# Patient Record
Sex: Female | Born: 1995 | Race: Black or African American | Hispanic: No | Marital: Single | Smoking: Former smoker
Health system: Southern US, Community
[De-identification: ages and names within clinical notes are randomized; demographics above are authoritative.]

## PROBLEM LIST (undated history)

## (undated) DIAGNOSIS — D649 Anemia, unspecified: Secondary | ICD-10-CM

## (undated) DIAGNOSIS — J45909 Unspecified asthma, uncomplicated: Secondary | ICD-10-CM

## (undated) HISTORY — PX: NO PAST SURGERIES: SHX2092

---
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
## 2017-01-17 ENCOUNTER — Ambulatory Visit: Payer: Self-pay | Admitting: Internal Medicine

## 2017-02-22 ENCOUNTER — Ambulatory Visit: Payer: Medicaid Other | Admitting: Family Medicine

## 2017-02-23 ENCOUNTER — Ambulatory Visit (INDEPENDENT_AMBULATORY_CARE_PROVIDER_SITE_OTHER): Payer: Medicaid Other | Admitting: Family Medicine

## 2017-02-23 ENCOUNTER — Encounter: Payer: Self-pay | Admitting: Family Medicine

## 2017-02-23 VITALS — BP 96/56 | HR 67 | Temp 98.5°F | Ht 60.0 in | Wt 103.2 lb

## 2017-02-23 DIAGNOSIS — Z3490 Encounter for supervision of normal pregnancy, unspecified, unspecified trimester: Secondary | ICD-10-CM

## 2017-02-23 DIAGNOSIS — Z641 Problems related to multiparity: Secondary | ICD-10-CM | POA: Insufficient documentation

## 2017-02-23 DIAGNOSIS — Z3401 Encounter for supervision of normal first pregnancy, first trimester: Secondary | ICD-10-CM

## 2017-02-23 DIAGNOSIS — O219 Vomiting of pregnancy, unspecified: Secondary | ICD-10-CM | POA: Diagnosis not present

## 2017-02-23 DIAGNOSIS — Z331 Pregnant state, incidental: Secondary | ICD-10-CM | POA: Insufficient documentation

## 2017-02-23 DIAGNOSIS — N912 Amenorrhea, unspecified: Secondary | ICD-10-CM | POA: Diagnosis not present

## 2017-02-23 HISTORY — DX: Encounter for supervision of normal pregnancy, unspecified, unspecified trimester: Z34.90

## 2017-02-23 LAB — POCT URINE PREGNANCY: Preg Test, Ur: POSITIVE — AB

## 2017-02-23 MED ORDER — PRENATAL 19 29-1 MG PO CHEW: 1.0000 | CHEWABLE_TABLET | Freq: Every day | ORAL | 3 refills | Status: DC

## 2017-02-23 MED ORDER — DOXYLAMINE-PYRIDOXINE 10-10 MG PO TBEC: 2.0000 | DELAYED_RELEASE_TABLET | Freq: Every day | ORAL | 3 refills | Status: DC

## 2017-02-23 NOTE — Assessment & Plan Note (Signed)
-   reassured by only 3 lb drop in weight since July; however, blood pressure is on the low side - prescribed Diclegis 10-10 mg 2-4 tablets daily - encouraged sips of gatorade and food as tolerated - will see patient on Monday or earlier for follow up of her nausea

## 2017-02-23 NOTE — Assessment & Plan Note (Signed)
-   will schedule dating ultrasound for the next 1-2 days - prescribed prenatal vitamins - will do OB panel in 1-2 weeks

## 2017-02-23 NOTE — Progress Notes (Signed)
   Subjective:    Kathleen Morrow - 21 y.o. female MRN [id number]  Date of birth: 11/26/1995  HPI  Kathleen Morrow is here after having two positive pregnancy tests at home about one week ago.   First pregnancy, first trimester - unplanned pregnancy, relationship with FOB is not good right now, although patient denies feeling unsafe around him - unsure if she will continue the pregnancy - reports feeling stressed due to this new pregnancy and her strained relationship with FOB - is not taking prenatal vitamins  Nausea during pregnancy - nausea is constant, with vomiting occurring throughout the day - has not been able to go to work, states showering is difficult - concerned about weight loss - has tried different juices and ginger, states that the only thing she can keep down is gatorade - this is also a source of stress for her   Health Maintenance:  Health Maintenance Due  Topic Date Due  . INFLUENZA VACCINE  12/01/2016    -  reports that she has never smoked. She has never used smokeless tobacco. - Review of Systems: Per HPI. - Past Medical History: Patient Active Problem List   Diagnosis Date Noted  . Encounter for supervision of normal first pregnancy in first trimester 02/23/2017  . Nausea and vomiting during pregnancy 02/23/2017  . 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 BEHAVIOR PROBLEM 09/25/2008  . ALLERGIC RHINITIS 09/13/2007  . ASTHMA, PERSISTENT 06/30/2006   - Medications: reviewed and updated   Objective:   Physical Exam BP (!) 96/56 (BP Location: Left Arm, Patient Position: Sitting, Cuff Size: Small)   Pulse 67   Temp 98.5 F (36.9 C) (Oral)   Ht 5' (1.524 m)   Wt 103 lb 3.2 oz (46.8 kg)   LMP 12/28/2016 (Approximate)   SpO2 99%   BMI 20.15 kg/m  Gen: NAD, alert, cooperative with exam, well-appearing HEENT: NCAT, clear conjunctiva, MMM, supple neck CV: RRR, good S1/S2, no murmur, no edema, capillary refill brisk   Resp: CTABL, no wheezes, non-labored Abd: SNTND, BS present, no guarding or organomegaly Skin: no rashes, normal turgor  Neuro: no gross deficits.  Psych: good insight, alert and oriented  Spitting frequently into a vomit bag during exam  Clinic performed pregnancy test is positive    Assessment & Plan:   Encounter for supervision of normal first pregnancy in first trimester - will schedule dating ultrasound for the next 1-2 days - prescribed prenatal vitamins - will do OB panel in 1-2 weeks  Nausea and vomiting during pregnancy - reassured by only 3 lb drop in weight since July; however, blood pressure is on the low side - prescribed Diclegis 10-10 mg 2-4 tablets daily - encouraged sips of gatorade and food as tolerated - will see patient on Monday or earlier for follow up of her nausea    Lezlie OctaveAmanda Rogina Schiano, M.D. 02/23/2017, 5:30 PM PGY-1, [facility]

## 2017-02-23 NOTE — Patient Instructions (Signed)
It was nice meeting you today Kathleen Morrow!  Please start taking Diclegis for your nausea.  Please start with 2 tablets at night, then if your nausea does not improve, take one tablet in the morning and 2 tablets at night.  If there is still no improvement, take MAX 4 tablets per day.  We would like for you to come back on Monday to follow up on your nausea.  We would like to see you in two weeks for your prenatal labs.  Please start taking prenatal vitamins for your and your baby's health.  If you have any questions or concerns, please feel free to call the clinic.   Be well,  Dr. Frances FurbishWinfrey

## 2017-02-24 ENCOUNTER — Telehealth: Payer: Self-pay | Admitting: *Deleted

## 2017-02-24 NOTE — Telephone Encounter (Signed)
-----   Message from Lennox SoldersAmanda C Winfrey, MD sent at 02/23/2017  5:16 PM EDT ----- Regarding: Dating US Scheduling Andreas BlowerHey Jessica,  Would you please schedule a dating OB ultrasound for [place]?  I would like for her to get this US in the next 1-2 days if possible.  She says the best number to call her is her mother's number at [phone].  Thank you!  Marchelle FolksAmanda

## 2017-02-24 NOTE — Telephone Encounter (Signed)
lmovm for pt to return call.   Appt [facility] for 10/30 @ 2pm [facility].  Please inform when she calls back.   Of note, This is the first available.  Will call back if provider has a different plan. Fleeger, Maryjo RochesterJessica Dawn, CMA

## 2017-03-01 ENCOUNTER — Ambulatory Visit (HOSPITAL_COMMUNITY)
Admission: RE | Admit: 2017-03-01 | Discharge: 2017-03-01 | Disposition: A | Discharge status: Home / Self Care | Payer: Medicaid Other | Source: Ambulatory Visit | Attending: Family Medicine | Admitting: Family Medicine

## 2017-03-01 DIAGNOSIS — Z3A01 Less than 8 weeks gestation of pregnancy: Secondary | ICD-10-CM | POA: Insufficient documentation

## 2017-03-01 DIAGNOSIS — Z3401 Encounter for supervision of normal first pregnancy, first trimester: Secondary | ICD-10-CM | POA: Diagnosis not present

## 2017-03-18 DIAGNOSIS — Z7253 High risk bisexual behavior: Secondary | ICD-10-CM | POA: Diagnosis not present

## 2017-03-18 DIAGNOSIS — O039 Complete or unspecified spontaneous abortion without complication: Secondary | ICD-10-CM | POA: Diagnosis not present

## 2017-05-20 ENCOUNTER — Ambulatory Visit: Payer: Medicaid Other | Admitting: Family Medicine

## 2017-06-14 ENCOUNTER — Ambulatory Visit: Payer: Medicaid Other | Admitting: Family Medicine

## 2017-08-03 ENCOUNTER — Ambulatory Visit: Payer: Medicaid Other | Admitting: Family Medicine

## 2017-08-24 ENCOUNTER — Other Ambulatory Visit (HOSPITAL_COMMUNITY)
Admission: RE | Admit: 2017-08-24 | Discharge: 2017-08-24 | Disposition: A | Discharge status: Home / Self Care | Payer: Medicaid Other | Source: Ambulatory Visit | Attending: Family Medicine | Admitting: Family Medicine

## 2017-08-24 ENCOUNTER — Telehealth: Payer: Self-pay

## 2017-08-24 ENCOUNTER — Encounter: Payer: Self-pay | Admitting: Family Medicine

## 2017-08-24 ENCOUNTER — Ambulatory Visit (INDEPENDENT_AMBULATORY_CARE_PROVIDER_SITE_OTHER): Payer: Medicaid Other | Admitting: Family Medicine

## 2017-08-24 VITALS — BP 100/60 | HR 72 | Temp 97.8°F | Ht 60.0 in | Wt 112.0 lb

## 2017-08-24 DIAGNOSIS — Z113 Encounter for screening for infections with a predominantly sexual mode of transmission: Secondary | ICD-10-CM | POA: Insufficient documentation

## 2017-08-24 DIAGNOSIS — Z3490 Encounter for supervision of normal pregnancy, unspecified, unspecified trimester: Secondary | ICD-10-CM | POA: Diagnosis not present

## 2017-08-24 DIAGNOSIS — Z3401 Encounter for supervision of normal first pregnancy, first trimester: Secondary | ICD-10-CM | POA: Diagnosis not present

## 2017-08-24 DIAGNOSIS — N926 Irregular menstruation, unspecified: Secondary | ICD-10-CM

## 2017-08-24 DIAGNOSIS — N898 Other specified noninflammatory disorders of vagina: Secondary | ICD-10-CM | POA: Diagnosis not present

## 2017-08-24 LAB — POCT WET PREP (WET MOUNT)
Clue Cells Wet Prep Whiff POC: NEGATIVE
TRICHOMONAS WET PREP HPF POC: ABSENT

## 2017-08-24 LAB — POCT URINE PREGNANCY: PREG TEST UR: POSITIVE — AB

## 2017-08-24 MED ORDER — PRENATAL 19 29-1 MG PO CHEW: 1.0000 | CHEWABLE_TABLET | Freq: Every day | ORAL | 3 refills | Status: DC

## 2017-08-24 NOTE — Telephone Encounter (Signed)
Called and spoke to patient informing her of her appointment of 08/26/2017 at [facility]. Arrival time is [phone] with a full bladder.  Patient was happy with appointment and time. Glennie Hawk.Simpson, Michelle R, CMA

## 2017-08-24 NOTE — Patient Instructions (Addendum)
Good to see you today!  Thanks for coming in.  Do not take any medications other than prenatal vitamins  We will order an US so you can know your dates,  We will call you to schedule it.  If you need any help making a decision please contact us  You do not have an infection that we can see now.  We will wait for cultures.  I think most of your discharge is due to RingoNair.  I would not use any other vaginal products.  It should improve in a few days

## 2017-08-24 NOTE — Assessment & Plan Note (Signed)
Likely due to Darene Lamerair that irritated mucosa.  No signs of infection.  Should resolve on own

## 2017-08-24 NOTE — Assessment & Plan Note (Signed)
Unsure of date.  Will get US.

## 2017-08-24 NOTE — Progress Notes (Signed)
Subjective  Kathleen Morrow is a 22 y.o. female is presenting with the following  PREGNANCY LMP thinks sometimes in early Feb?   Not sure whether she wants to keep this pregnancy.  Still weighing her options.  Wants to know her date.  No nausea and vomiting  VAGINAL IRRITATION Has whitsh vaginal discharge after she used Nair hair removal in her pubic area.  Feels irritated.  Did not have a discharge prior to this.  Unsure if her female partner has any std symptoms.  No pain or lesions or fever   Chief Complaint noted Review of Symptoms - see HPI PMH - Smoking status noted.    Objective Vital Signs reviewed BP 100/60 (BP Location: Left Arm, Patient Position: Sitting, Cuff Size: Normal)   Pulse 72   Temp 97.8 F (36.6 C) (Oral)   Ht 5' (1.524 m)   Wt 112 lb (50.8 kg)   LMP 06/08/2017   SpO2 100%   BMI 21.87 kg/m   Vaginal exam - whitish discoloration of mucosa just inside vagina.  No discharge from os or in vault Wet prep and cultures done  Assessments/Plans  See after visit summary for details of patient instuctions  Pregnancy Unsure of date.  Will get US.    Vaginal irritation Likely due to Darene Lamerair that irritated mucosa.  No signs of infection.  Should resolve on own

## 2017-08-25 ENCOUNTER — Telehealth: Payer: Self-pay | Admitting: Family Medicine

## 2017-08-25 ENCOUNTER — Telehealth: Payer: Self-pay

## 2017-08-25 LAB — CERVICOVAGINAL ANCILLARY ONLY
Chlamydia: POSITIVE — AB
NEISSERIA GONORRHEA: NEGATIVE

## 2017-08-25 NOTE — Telephone Encounter (Signed)
Pt was returning the call concerning her lab results.

## 2017-08-25 NOTE — Telephone Encounter (Signed)
[facility] of Prenatal 19. Pharmacist states PrePlus is preferred with Medicaid. Changed to PrePlus. Ples SpecterAlisa Romulo Okray, RN [facility] RN)

## 2017-08-25 NOTE — Telephone Encounter (Addendum)
Positive chlamydia  Left VM to call us  Need Azithromycin 1 gm by mouth and Rocephin 125 mg IM  once observed therapy.  Please have her come in for a nurse visit

## 2017-08-25 NOTE — Telephone Encounter (Signed)
Pt informed and scheduled her for a nurse visit tomorrow. Purvis Sidle Bruna PotterBlount, CMA

## 2017-08-26 ENCOUNTER — Ambulatory Visit (HOSPITAL_COMMUNITY)
Admission: RE | Admit: 2017-08-26 | Discharge: 2017-08-26 | Disposition: A | Discharge status: Home / Self Care | Payer: Medicaid Other | Source: Ambulatory Visit | Attending: Family Medicine | Admitting: Family Medicine

## 2017-08-26 ENCOUNTER — Ambulatory Visit (INDEPENDENT_AMBULATORY_CARE_PROVIDER_SITE_OTHER): Payer: Medicaid Other

## 2017-08-26 DIAGNOSIS — Z3A11 11 weeks gestation of pregnancy: Secondary | ICD-10-CM | POA: Insufficient documentation

## 2017-08-26 DIAGNOSIS — Z202 Contact with and (suspected) exposure to infections with a predominantly sexual mode of transmission: Secondary | ICD-10-CM | POA: Diagnosis not present

## 2017-08-26 DIAGNOSIS — Z3687 Encounter for antenatal screening for uncertain dates: Secondary | ICD-10-CM | POA: Insufficient documentation

## 2017-08-26 DIAGNOSIS — A749 Chlamydial infection, unspecified: Secondary | ICD-10-CM | POA: Diagnosis not present

## 2017-08-26 DIAGNOSIS — Z3401 Encounter for supervision of normal first pregnancy, first trimester: Secondary | ICD-10-CM

## 2017-08-26 MED ORDER — CEFTRIAXONE SODIUM 250 MG IJ SOLR
250.0000 mg | Freq: Once | INTRAMUSCULAR | Status: AC
  Administered 2017-08-26: 250 mg via INTRAMUSCULAR

## 2017-08-26 MED ORDER — AZITHROMYCIN 500 MG PO TABS
1000.0000 mg | ORAL_TABLET | Freq: Once | ORAL | Status: AC
  Administered 2017-08-26: 1000 mg via ORAL

## 2017-08-26 NOTE — Progress Notes (Signed)
Patient in nurse clinic today for STD treatment of chlamydia and gonorrhea prophylaxis treatment.  Clarified order for both azithromycin 1g and ceftriaxone 250mg  with Dr Deirdre Priesthambliss. Patient advised to abstain from sex for 7-10 days after treatment or when partner has been tested/treated.  Azithromycin 1 GM PO x 1 given and Ceftriaxone 250 mg IM x 1 given in LUOQ  per Dr. Golden Pophamblis's orders.  Patient to follow up in 2-3 months for re-screening.  Advised to use condoms with all sexual activity.  STD report form fax completed and [facility] at [phone] (STD department).  Patient verbalized understanding.  Shawna OrleansMeredith B Thomsen, RN

## 2017-09-30 ENCOUNTER — Ambulatory Visit: Payer: Medicaid Other | Admitting: Family Medicine

## 2018-01-03 DIAGNOSIS — N83202 Unspecified ovarian cyst, left side: Secondary | ICD-10-CM | POA: Diagnosis not present

## 2018-01-03 DIAGNOSIS — N730 Acute parametritis and pelvic cellulitis: Secondary | ICD-10-CM | POA: Diagnosis not present

## 2018-01-03 DIAGNOSIS — K5900 Constipation, unspecified: Secondary | ICD-10-CM | POA: Diagnosis not present

## 2018-01-03 DIAGNOSIS — R1032 Left lower quadrant pain: Secondary | ICD-10-CM | POA: Diagnosis not present

## 2018-01-03 DIAGNOSIS — Z3202 Encounter for pregnancy test, result negative: Secondary | ICD-10-CM | POA: Diagnosis not present

## 2018-01-19 DIAGNOSIS — L03114 Cellulitis of left upper limb: Secondary | ICD-10-CM | POA: Diagnosis not present

## 2018-04-10 DIAGNOSIS — N946 Dysmenorrhea, unspecified: Secondary | ICD-10-CM | POA: Diagnosis not present

## 2018-04-10 DIAGNOSIS — Z114 Encounter for screening for human immunodeficiency virus [HIV]: Secondary | ICD-10-CM | POA: Diagnosis not present

## 2018-04-10 DIAGNOSIS — Z0000 Encounter for general adult medical examination without abnormal findings: Secondary | ICD-10-CM | POA: Diagnosis not present

## 2018-04-10 DIAGNOSIS — Z7251 High risk heterosexual behavior: Secondary | ICD-10-CM | POA: Diagnosis not present

## 2018-04-10 DIAGNOSIS — Z131 Encounter for screening for diabetes mellitus: Secondary | ICD-10-CM | POA: Diagnosis not present

## 2018-04-10 DIAGNOSIS — Z13220 Encounter for screening for lipoid disorders: Secondary | ICD-10-CM | POA: Diagnosis not present

## 2018-05-08 DIAGNOSIS — N898 Other specified noninflammatory disorders of vagina: Secondary | ICD-10-CM | POA: Diagnosis not present

## 2018-05-08 DIAGNOSIS — N83202 Unspecified ovarian cyst, left side: Secondary | ICD-10-CM | POA: Diagnosis not present

## 2018-05-08 DIAGNOSIS — Z3202 Encounter for pregnancy test, result negative: Secondary | ICD-10-CM | POA: Diagnosis not present

## 2018-05-08 DIAGNOSIS — R1032 Left lower quadrant pain: Secondary | ICD-10-CM | POA: Diagnosis not present

## 2018-05-09 DIAGNOSIS — R1032 Left lower quadrant pain: Secondary | ICD-10-CM | POA: Diagnosis not present

## 2018-05-15 DIAGNOSIS — Z202 Contact with and (suspected) exposure to infections with a predominantly sexual mode of transmission: Secondary | ICD-10-CM | POA: Diagnosis not present

## 2018-05-23 DIAGNOSIS — A749 Chlamydial infection, unspecified: Secondary | ICD-10-CM | POA: Diagnosis not present

## 2018-05-23 DIAGNOSIS — A549 Gonococcal infection, unspecified: Secondary | ICD-10-CM | POA: Diagnosis not present

## 2018-05-23 DIAGNOSIS — R1084 Generalized abdominal pain: Secondary | ICD-10-CM | POA: Diagnosis not present

## 2018-05-23 DIAGNOSIS — N760 Acute vaginitis: Secondary | ICD-10-CM | POA: Diagnosis not present

## 2018-08-29 ENCOUNTER — Encounter (HOSPITAL_BASED_OUTPATIENT_CLINIC_OR_DEPARTMENT_OTHER): Payer: Self-pay

## 2018-08-29 ENCOUNTER — Ambulatory Visit: Payer: Medicaid Other

## 2018-08-29 ENCOUNTER — Other Ambulatory Visit: Payer: Self-pay

## 2018-08-29 ENCOUNTER — Emergency Department (HOSPITAL_BASED_OUTPATIENT_CLINIC_OR_DEPARTMENT_OTHER)
Admission: EM | Admit: 2018-08-29 | Discharge: 2018-08-29 | Disposition: A | Discharge status: Home / Self Care | Payer: Medicaid Other | Attending: Emergency Medicine | Admitting: Emergency Medicine

## 2018-08-29 DIAGNOSIS — O99511 Diseases of the respiratory system complicating pregnancy, first trimester: Secondary | ICD-10-CM | POA: Insufficient documentation

## 2018-08-29 DIAGNOSIS — O210 Mild hyperemesis gravidarum: Secondary | ICD-10-CM | POA: Diagnosis not present

## 2018-08-29 DIAGNOSIS — Z3491 Encounter for supervision of normal pregnancy, unspecified, first trimester: Secondary | ICD-10-CM

## 2018-08-29 DIAGNOSIS — Z3A00 Weeks of gestation of pregnancy not specified: Secondary | ICD-10-CM | POA: Diagnosis not present

## 2018-08-29 DIAGNOSIS — O218 Other vomiting complicating pregnancy: Secondary | ICD-10-CM | POA: Diagnosis not present

## 2018-08-29 DIAGNOSIS — O219 Vomiting of pregnancy, unspecified: Secondary | ICD-10-CM | POA: Diagnosis present

## 2018-08-29 LAB — URINALYSIS, ROUTINE W REFLEX MICROSCOPIC
Bilirubin Urine: NEGATIVE
Glucose, UA: NEGATIVE mg/dL
Hgb urine dipstick: NEGATIVE
Ketones, ur: NEGATIVE mg/dL
Nitrite: NEGATIVE
Protein, ur: NEGATIVE mg/dL
Specific Gravity, Urine: 1.01 (ref 1.005–1.030)
pH: 8 (ref 5.0–8.0)

## 2018-08-29 LAB — URINALYSIS, MICROSCOPIC (REFLEX)

## 2018-08-29 LAB — PREGNANCY, URINE: Preg Test, Ur: POSITIVE — AB

## 2018-08-29 MED ORDER — ONDANSETRON 4 MG PO TBDP: 4.0000 mg | ORAL_TABLET | Freq: Three times a day (TID) | ORAL | 0 refills | Status: DC | PRN

## 2018-08-29 MED ORDER — ONDANSETRON 8 MG PO TBDP
8.0000 mg | ORAL_TABLET | Freq: Once | ORAL | Status: AC
  Administered 2018-08-29: 13:00:00 8 mg via ORAL
  Filled 2018-08-29: qty 1

## 2018-08-29 MED ORDER — PRENATAL COMPLETE 14-0.4 MG PO TABS: 1.0000 | ORAL_TABLET | Freq: Every day | ORAL | 0 refills | Status: DC

## 2018-08-29 NOTE — ED Provider Notes (Signed)
[facility] EMERGENCY DEPARTMENT Provider Note   CSN: [id number] Arrival date & time: 08/29/18  1232    History   Chief Complaint Chief Complaint  Patient presents with  . Nausea    HPI Kathleen Morrow is a 23 y.o. female.     23yo F w/ h/o asthma G3P2 who p/w nausea and vomiting. Pt had a positive home pregnancy test yesterday. She has not yet established care w/ OBGYN. She states that recently she has had nausea and vomiting, especially in the mornings, associated w/ fatigue and dizziness. She denies any abdominal pain, diarrhea, constipation, fevers, cough, vaginal bleeding/discharge, or urinary symptoms. She had morning sickness w/ a previous pregnancy and this feels similar. LMP was last month.  The history is provided by the patient.    Past Medical History:  Diagnosis Date  . Asthma     Patient Active Problem List   Diagnosis Date Noted  . Pregnancy 02/23/2017  . 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 BEHAVIOR PROBLEM 09/25/2008  . ASTHMA, PERSISTENT 06/30/2006    Past Surgical History:  Procedure Laterality Date  . NO PAST SURGERIES       OB History    Gravida  0   Para      Term      Preterm      AB      Living        SAB      TAB      Ectopic      Multiple      Live Births               Home Medications    Prior to Admission medications   Medication Sig Start Date End Date Taking? Authorizing Provider  ondansetron (ZOFRAN ODT) 4 MG disintegrating tablet Take 1 tablet (4 mg total) by mouth every 8 (eight) hours as needed for nausea or vomiting. 08/29/18   Delois Tolbert, Ambrose Finlandachel Morgan, MD  Prenatal Vit-Fe Fumarate-FA (PRENATAL COMPLETE) 14-0.4 MG TABS Take 1 tablet by mouth daily. 08/29/18   Latunya Kissick, Ambrose Finlandachel Morgan, MD    Family History Family History  Problem Relation Age of Onset  . Epilepsy Brother     Social History Social History   Tobacco Use  . Smoking status: Never Smoker  . Smokeless tobacco: Never Used   Substance Use Topics  . Alcohol use: No  . Drug use: No     Allergies   Pollen extract and Shellfish allergy   Review of Systems Review of Systems All other systems reviewed and are negative except that which was mentioned in HPI   Physical Exam Updated Vital Signs BP 114/61 (BP Location: Right Arm)   Pulse 94   Temp 98.4 F (36.9 C) (Oral)   Resp 14   Ht 5' (1.524 m)   Wt 48.1 kg   SpO2 100%   BMI 20.70 kg/m   Physical Exam Vitals signs and nursing note reviewed.  Constitutional:      General: She is not in acute distress.    Appearance: She is well-developed.  HENT:     Head: Normocephalic and atraumatic.     Nose: Nose normal.     Mouth/Throat:     Mouth: Mucous membranes are moist.     Pharynx: Oropharynx is clear.  Eyes:     Conjunctiva/sclera: Conjunctivae normal.  Neck:     Musculoskeletal: Neck supple.  Cardiovascular:     Rate and Rhythm: Normal rate and regular rhythm.  Heart sounds: Normal heart sounds. No murmur.  Pulmonary:     Effort: Pulmonary effort is normal.     Breath sounds: Normal breath sounds.  Abdominal:     General: Bowel sounds are normal. There is no distension.     Palpations: Abdomen is soft.     Tenderness: There is no abdominal tenderness.  Musculoskeletal:     Right lower leg: No edema.     Left lower leg: No edema.  Skin:    General: Skin is warm and dry.  Neurological:     Mental Status: She is alert and oriented to person, place, and time.     Comments: Fluent speech  Psychiatric:        Judgment: Judgment normal.      ED Treatments / Results  Labs (all labs ordered are listed, but only abnormal results are displayed) Labs Reviewed  PREGNANCY, URINE - Abnormal; Notable for the following components:      Result Value   Preg Test, Ur POSITIVE (*)    All other components within normal limits  URINALYSIS, ROUTINE W REFLEX MICROSCOPIC - Abnormal; Notable for the following components:   Leukocytes,Ua TRACE  (*)    All other components within normal limits  URINALYSIS, MICROSCOPIC (REFLEX) - Abnormal; Notable for the following components:   Bacteria, UA RARE (*)    All other components within normal limits    EKG None  Radiology No results found.  Procedures Procedures (including critical care time)  Medications Ordered in ED Medications  ondansetron (ZOFRAN-ODT) disintegrating tablet 8 mg (8 mg Oral Given 08/29/18 1259)     Initial Impression / Assessment and Plan / ED Course  I have reviewed the triage vital signs and the nursing notes.  Pertinent labs  that were available during my care of the patient were reviewed by me and considered in my medical decision making (see chart for details).       Well appearing, normal VS, no abd tenderness or complaints of pain. UPT positive, UA without evidence of infection. No pain or bleeding to suggest ectopic pregnancy. After zofran, she was able to drink an apple juice w/ no vomiting in the ED. Pattern of sx suggests morning sickness related to first trimester pregnancy. She previously followed w/ [facility] therefore will have her contact for appointment. Counseled on prenatal vitamins, supportive measures to help with nausea, avoidance of alcohol/tobacco/drugs. Return precautions reviewed.  Final Clinical Impressions(s) / ED Diagnoses   Final diagnoses:  Morning sickness  First trimester pregnancy    ED Discharge Orders         Ordered    Prenatal Vit-Fe Fumarate-FA (PRENATAL COMPLETE) 14-0.4 MG TABS  Daily     08/29/18 1428    ondansetron (ZOFRAN ODT) 4 MG disintegrating tablet  Every 8 hours PRN     08/29/18 1428           Salah Nakamura, Ambrose Finland, MD 08/29/18 1429

## 2018-08-29 NOTE — ED Triage Notes (Signed)
Nausea, weakness past week, states positive pregnancy test at home

## 2018-10-31 ENCOUNTER — Other Ambulatory Visit: Payer: Self-pay

## 2018-10-31 ENCOUNTER — Encounter: Payer: Self-pay | Admitting: Family Medicine

## 2018-10-31 ENCOUNTER — Other Ambulatory Visit (HOSPITAL_COMMUNITY)
Admission: RE | Admit: 2018-10-31 | Discharge: 2018-10-31 | Disposition: A | Discharge status: Home / Self Care | Payer: Medicaid Other | Source: Ambulatory Visit | Attending: Family Medicine | Admitting: Family Medicine

## 2018-10-31 ENCOUNTER — Ambulatory Visit (INDEPENDENT_AMBULATORY_CARE_PROVIDER_SITE_OTHER): Payer: Medicaid Other | Admitting: Family Medicine

## 2018-10-31 VITALS — BP 100/64 | HR 84

## 2018-10-31 DIAGNOSIS — Z309 Encounter for contraceptive management, unspecified: Secondary | ICD-10-CM

## 2018-10-31 DIAGNOSIS — N949 Unspecified condition associated with female genital organs and menstrual cycle: Secondary | ICD-10-CM | POA: Insufficient documentation

## 2018-10-31 DIAGNOSIS — R1030 Lower abdominal pain, unspecified: Secondary | ICD-10-CM

## 2018-10-31 DIAGNOSIS — N760 Acute vaginitis: Secondary | ICD-10-CM | POA: Diagnosis not present

## 2018-10-31 LAB — POCT WET PREP (WET MOUNT)
Clue Cells Wet Prep Whiff POC: NEGATIVE
Trichomonas Wet Prep HPF POC: ABSENT

## 2018-10-31 LAB — POCT URINE PREGNANCY: Preg Test, Ur: NEGATIVE

## 2018-10-31 MED ORDER — NORETHIN ACE-ETH ESTRAD-FE 1-20 MG-MCG PO TABS: 1.0000 | ORAL_TABLET | Freq: Every day | ORAL | 11 refills | Status: DC

## 2018-10-31 NOTE — Patient Instructions (Signed)
Vaginitis  Vaginitis is irritation and swelling (inflammation) of the vagina. It happens when normal bacteria and yeast in the vagina grow too much. There are many types of this condition. Treatment will depend on the type you have. Follow these instructions at home: Lifestyle  Keep your vagina area clean and dry. ? Avoid using soap. ? Rinse the area with water.  Do not do the following until your doctor says it is okay: ? Wash and clean out the vagina (douche). ? Use tampons. ? Have sex.  Wipe from front to back after going to the bathroom.  Let air reach your vagina. ? Wear cotton underwear. ? Do not wear: ? Underwear while you sleep. ? Tight pants. ? Thong underwear. ? Underwear or nylons without a cotton panel. ? Take off any wet clothing, such as bathing suits, as soon as possible.  Use gentle, non-scented products. Do not use things that can irritate the vagina, such as fabric softeners. Avoid the following products if they are scented: ? Feminine sprays. ? Detergents. ? Tampons. ? Feminine hygiene products. ? Soaps or bubble baths.  Practice safe sex and use condoms. General instructions  Take over-the-counter and prescription medicines only as told by your doctor.  If you were prescribed an antibiotic medicine, take or use it as told by your doctor. Do not stop taking or using the antibiotic even if you start to feel better.  Keep all follow-up visits as told by your doctor. This is important. Contact a doctor if:  You have pain in your belly.  You have a fever.  Your symptoms last for more than 2-3 days. Get help right away if:  You have a fever and your symptoms get worse all of a sudden. Summary  Vaginitis is irritation and swelling of the vagina. It can happen when the normal bacteria and yeast in the vagina grow too much. There are many types.  Treatment will depend on the type you have.  Do not douche, use tampons , or have sex until your health  care provider approves. When you can return to sex, practice safe sex and use condoms. This information is not intended to replace advice given to you by your health care provider. Make sure you discuss any questions you have with your health care provider. Document Released: 07/16/2008 Document Revised: 04/01/2017 Document Reviewed: 05/11/2016 Elsevier Patient Education  2020 Elsevier Inc.  

## 2018-10-31 NOTE — Progress Notes (Signed)
Subjective:     Patient ID: Kathleen Morrow, female   DOB: 1995/06/20, 23 y.o.   MRN: [id number]  HPI Abdominal pain and vaginal discharge: She had an abortion on May 26th at [facility] choice, she was supposed to f/u 1 month after but yet to do so. She has been having lower abdomen cramp with vagina pain intermittently since then. Denies heavy bleeding or abnormal menses, at times she has abnormal vaginal discharge.Denies ulcers or bumps on her vulva.  She is sexually active, uses condoms for protection. LMP: 10/26/18 and her period lasted for 5 days, regular flow associated with some cramping. Birth control: She will like to get back on her pills which she did well on before.   Current Outpatient Medications on File Prior to Visit  Medication Sig Dispense Refill  . ibuprofen (ADVIL) 200 MG tablet Take 200 mg by mouth every 6 (six) hours as needed.     No current facility-administered medications on file prior to visit.    Past Medical History:  Diagnosis Date  . Asthma    Vitals:   10/31/18 1107  BP: 100/64  Pulse: 84  SpO2: 96%     Review of Systems  Respiratory: Negative.   Cardiovascular: Negative.   Gastrointestinal: Negative.   Genitourinary: Positive for vaginal discharge and vaginal pain. Negative for difficulty urinating, dysuria, genital sores, menstrual problem and vaginal bleeding.  All other systems reviewed and are negative.      Objective:   Physical Exam Vitals signs and nursing note reviewed. Exam conducted with a chaperone present (Jazmin Hartsell).  Constitutional:      Appearance: She is not ill-appearing.  Cardiovascular:     Rate and Rhythm: Normal rate and regular rhythm.     Heart sounds: Normal heart sounds. No murmur.  Pulmonary:     Effort: Pulmonary effort is normal. No respiratory distress.     Breath sounds: No stridor. No wheezing or rhonchi.  Abdominal:     General: Abdomen is flat. Bowel sounds are normal. There is no distension.   Palpations: There is no mass.     Tenderness: There is no abdominal tenderness.  Genitourinary:    Exam position: Lithotomy position.     Pubic Area: No rash.      Labia:        Right: No tenderness or lesion.        Left: No tenderness or lesion.      Vagina: Normal.     Cervix: Discharge present. No cervical motion tenderness.     Uterus: Normal.      Adnexa: Right adnexa normal and left adnexa normal.     Neurological:     Mental Status: She is alert.  Psychiatric:        Mood and Affect: Mood normal.        Assessment:     Abdominal cramping  Vaginitis Birth control    Plan:     Exam benign except for some scanty vaginal discharge. Wet prep negative. GC/Chlamydia checked. She just got off her period, hence, cramping likely cyclical. Continue Ibuprofen as needed and monitor closely.  I reviewed her med list, she had been on Loestrin Fe in the past. I refilled meds. Upreg negative today.  I will contact her with result.  She will benefit from HIV and RPR check as well. I will discuss with her when I call her with results. She will need to come in later for lab work.

## 2018-11-01 ENCOUNTER — Other Ambulatory Visit: Payer: Self-pay | Admitting: Family Medicine

## 2018-11-01 ENCOUNTER — Telehealth: Payer: Self-pay | Admitting: Family Medicine

## 2018-11-01 DIAGNOSIS — Z114 Encounter for screening for human immunodeficiency virus [HIV]: Secondary | ICD-10-CM

## 2018-11-01 DIAGNOSIS — Z113 Encounter for screening for infections with a predominantly sexual mode of transmission: Secondary | ICD-10-CM

## 2018-11-01 LAB — CERVICOVAGINAL ANCILLARY ONLY
Chlamydia: POSITIVE — AB
Neisseria Gonorrhea: NEGATIVE

## 2018-11-01 NOTE — Telephone Encounter (Signed)
HIPAA compliant callback message left.  Please let her know that her Chlamydia test is positive. I need her to come to the clinic for treatment. I will also want her to get HIV and RPR checked at the same time.  I will go ahead and enter a future lab order for HIV and RPR.  I will forward message to RN to f/u with patient in the morning incase she does not call back. They will also need to report to the health department.

## 2018-11-02 NOTE — Telephone Encounter (Signed)
HIPAA compliant callback message left.  Need to discuss +Chlamydia result and management plan.  I also want her to come for lab visit for HIV and RPR testing.

## 2018-11-02 NOTE — Telephone Encounter (Signed)
Call goes straight to voicemail, no rings.  Will try again later.  Christen Bame, CMA

## 2018-11-02 NOTE — Telephone Encounter (Signed)
No response.  HIPAA compliant callback message left.

## 2018-11-02 NOTE — Telephone Encounter (Signed)
Called again.  Straight to VM. Christen Bame, CMA

## 2018-11-03 ENCOUNTER — Encounter: Payer: Self-pay | Admitting: Family Medicine

## 2018-11-03 NOTE — Telephone Encounter (Signed)
Attempted to reach patient about her test result again. HIPAA compliant callback message left.   Kathleen Morrow, please go ahead and report positive result to the health department and let them know that we have not been able to reach the patient for treatment.  Thanks.

## 2018-11-06 NOTE — Telephone Encounter (Signed)
Placed from in to be faxed box. Salvatore Marvel, CMA

## 2018-11-07 ENCOUNTER — Other Ambulatory Visit: Payer: Self-pay | Admitting: Family Medicine

## 2018-11-07 ENCOUNTER — Telehealth: Payer: Self-pay | Admitting: Family Medicine

## 2018-11-07 MED ORDER — AZITHROMYCIN 500 MG PO TABS: 1000.0000 mg | ORAL_TABLET | Freq: Once | ORAL | 0 refills | Status: AC

## 2018-11-07 NOTE — Telephone Encounter (Signed)
Pt informed and report sent to HD. Christen Bame, CMA

## 2018-11-07 NOTE — Telephone Encounter (Signed)
Med escribed. Thanks.

## 2018-11-07 NOTE — Telephone Encounter (Signed)
HIPAA compliant callback message.  Still unable to reach her to discuss test result.   Per documentation, it seems the cell phone number listed for her is her mother's cell phone.   I called the home phone listed for her mom without response. I did not leave a message on the home phone line.   At this point, it is unclear to me how to reach her. I will mail her a letter to contact our office soon.    I will have the RN report to the HD and let them know that we have not been able to reach her for treatment.

## 2018-11-07 NOTE — Telephone Encounter (Signed)
Pt returned call.  Explained results and answered all questions.  Advised to always use condoms and abstain from sex until one week after both partners are treated.  Pt states that she has a crazy work schedule and wants to know if we can send in the treatment to [facility] [address] in [place].  Will forward to MD.  Pt will come Monday AM for blood draw (HIV/RPR).   Will also send an updated HD report once treatment sent or given in office. Christen Bame, CMA

## 2018-11-07 NOTE — Telephone Encounter (Signed)
We have already faxed the info to the health department yesterday.  See phone note from 11/01/18. Christen Bame, CMA

## 2018-11-07 NOTE — Telephone Encounter (Signed)
Thanks

## 2018-11-09 ENCOUNTER — Other Ambulatory Visit: Payer: Self-pay | Admitting: Family Medicine

## 2018-11-09 ENCOUNTER — Telehealth: Payer: Self-pay

## 2018-11-09 MED ORDER — DOXYCYCLINE HYCLATE 100 MG PO TABS: 100.0000 mg | ORAL_TABLET | Freq: Two times a day (BID) | ORAL | 0 refills | Status: AC

## 2018-11-09 NOTE — Telephone Encounter (Signed)
LM for patient to call back if she has any questions.  New medication called in and asked her to make a follow up appt in 1 mont.  Arjun Hard,CMA

## 2018-11-09 NOTE — Telephone Encounter (Signed)
Pt called back without checking voicemail .Gave pt info below. Pt will schedule an appt in about 1 month. Ottis Stain, CMA

## 2018-11-09 NOTE — Telephone Encounter (Signed)
Please call patient and let her know that I have escribed Doxycycline to her pharmacy. Please advise her to schedule an appointment with me for chlamydia test of cure in about 3-4 weeks. Thanks

## 2018-11-09 NOTE — Telephone Encounter (Signed)
Pt calling to ask Dr. Gwendlyn Deutscher a question about the medication she was given, azithromycin (did not see the Rx in her chart). She took it on a empty stomach and 20 mins later she vomited the medication up. Pt is wanting to know if she should take a different medication. Please call her on [phone]. Please advise. Ottis Stain, CMA

## 2018-11-13 ENCOUNTER — Other Ambulatory Visit: Payer: Medicaid Other

## 2019-01-09 ENCOUNTER — Other Ambulatory Visit: Payer: Self-pay

## 2019-01-09 ENCOUNTER — Encounter: Payer: Self-pay | Admitting: Family Medicine

## 2019-01-09 ENCOUNTER — Other Ambulatory Visit (HOSPITAL_COMMUNITY)
Admission: RE | Admit: 2019-01-09 | Discharge: 2019-01-09 | Disposition: A | Discharge status: Home / Self Care | Payer: Medicaid Other | Source: Ambulatory Visit | Attending: Family Medicine | Admitting: Family Medicine

## 2019-01-09 ENCOUNTER — Ambulatory Visit (INDEPENDENT_AMBULATORY_CARE_PROVIDER_SITE_OTHER): Payer: Medicaid Other | Admitting: Family Medicine

## 2019-01-09 VITALS — BP 106/58 | HR 66

## 2019-01-09 DIAGNOSIS — Z113 Encounter for screening for infections with a predominantly sexual mode of transmission: Secondary | ICD-10-CM

## 2019-01-09 DIAGNOSIS — N898 Other specified noninflammatory disorders of vagina: Secondary | ICD-10-CM | POA: Insufficient documentation

## 2019-01-09 LAB — POCT WET PREP (WET MOUNT)
Clue Cells Wet Prep Whiff POC: NEGATIVE
Trichomonas Wet Prep HPF POC: ABSENT

## 2019-01-09 NOTE — Progress Notes (Signed)
  Subjective:   Patient ID: Kathleen Morrow    DOB: 11-14-1995, 23 y.o. female   MRN: [id number]  Kathleen Morrow is a 23 y.o. female with a history of migraine, asthma here for   VAGINAL DISCHARGE -Seen previously 6/30 for similar, positive for chlamydia.  Given doxycycline given she vomited up azithromycin. Took first 2 days of doxycycline but didn't finish 2/2 nausea.  -Here for test of cure.  Having abnormal vaginal discharge and odor for 7 days. Discharge consistency: "crumbly" Discharge color: white Medications tried: no  Recent antibiotic use: no Sex in last month: yes Possible STD exposure: yes, no condoms.  Symptoms Fever: no Dysuria: no Vaginal bleeding: no Abdomen or Pelvic pain: no Back pain: no Genital sores or ulcers:no Rash: no Pain during sex: no Missed menstrual period: no, 12/24/18. Was without contraception for a week as they were out at the pharmacy and couldn't get a refill.  Review of Systems:  Per HPI.  [place], medications and smoking status reviewed.  Objective:   BP (!) 106/58   Pulse 66   LMP 12/24/2018 (Approximate)   SpO2 98%  Vitals and nursing note reviewed.  General: well nourished, well developed, in no acute distress with non-toxic appearance GYN:  External genitalia within normal limits.  Vaginal mucosa pink, moist, normal rugae.  Nonfriable cervix without lesions, small amount of yellow sticky discharge noted on speculum exam. No bleeding.   No cervical motion tenderness.  Skin: warm, dry, no rashes or lesions Extremities: warm and well perfused, normal tone MSK: ROM grossly intact, gait normal Neuro: Alert and oriented, speech normal  Assessment & Plan:   Vaginal discharge Wet prep negative. Also obtained GC/CT, will call with results and treat as indicated. May likely have chlamydia given she did not finish prior abx course. If so, would recommend EPT as well. Safe sex practices reviewed. Red flags discussed.  Orders Placed  This Encounter  Procedures  . POCT Wet Prep [facility])   No orders of the defined types were placed in this encounter.   Rory Percy, DO PGY-3, [facility] 01/09/2019 8:46 PM

## 2019-01-09 NOTE — Assessment & Plan Note (Signed)
Wet prep negative. Also obtained GC/CT, will call with results and treat as indicated. May likely have chlamydia given she did not finish prior abx course. If so, would recommend EPT as well. Safe sex practices reviewed. Red flags discussed.

## 2019-01-09 NOTE — Patient Instructions (Signed)
It was great to see you!  Our plans for today:  - We are checking some labs today, we will call you or send you a letter if they are abnormal.  - Make sure you wear a condom every time you have sex for the full time. - If you have new back or pelvic pain or if you develop fevers, come back to see [place].  Take care and seek immediate care sooner if you develop any concerns.   Dr. [facility]

## 2019-01-11 ENCOUNTER — Telehealth: Payer: Self-pay | Admitting: Family Medicine

## 2019-01-11 LAB — CERVICOVAGINAL ANCILLARY ONLY
Chlamydia: NEGATIVE
Neisseria Gonorrhea: NEGATIVE

## 2019-01-11 NOTE — Telephone Encounter (Signed)
Pt is calling back to return two missed calls from our number. She said the calls were back to back and there was no voicemail left.   I did not see an encounter so I told patient I would sent a note back asking whoever called to please call her back.

## 2019-01-11 NOTE — Telephone Encounter (Signed)
Patient informed of results. Shanti Agresti,CMA  

## 2019-03-07 ENCOUNTER — Telehealth: Payer: Self-pay

## 2019-03-07 ENCOUNTER — Ambulatory Visit (INDEPENDENT_AMBULATORY_CARE_PROVIDER_SITE_OTHER): Payer: Medicaid Other | Admitting: Family Medicine

## 2019-03-07 ENCOUNTER — Other Ambulatory Visit (HOSPITAL_COMMUNITY)
Admission: RE | Admit: 2019-03-07 | Discharge: 2019-03-07 | Disposition: A | Discharge status: Home / Self Care | Payer: Medicaid Other | Source: Ambulatory Visit | Attending: Family Medicine | Admitting: Family Medicine

## 2019-03-07 ENCOUNTER — Other Ambulatory Visit: Payer: Self-pay

## 2019-03-07 VITALS — BP 95/70 | HR 69 | Wt 106.4 lb

## 2019-03-07 DIAGNOSIS — N912 Amenorrhea, unspecified: Secondary | ICD-10-CM | POA: Diagnosis not present

## 2019-03-07 DIAGNOSIS — N898 Other specified noninflammatory disorders of vagina: Secondary | ICD-10-CM | POA: Diagnosis not present

## 2019-03-07 DIAGNOSIS — N760 Acute vaginitis: Secondary | ICD-10-CM

## 2019-03-07 DIAGNOSIS — B9689 Other specified bacterial agents as the cause of diseases classified elsewhere: Secondary | ICD-10-CM | POA: Diagnosis not present

## 2019-03-07 LAB — POCT WET PREP (WET MOUNT)
Clue Cells Wet Prep Whiff POC: POSITIVE
Trichomonas Wet Prep HPF POC: ABSENT

## 2019-03-07 LAB — POCT URINE PREGNANCY: Preg Test, Ur: NEGATIVE

## 2019-03-07 MED ORDER — METRONIDAZOLE 500 MG PO TABS: 500.0000 mg | ORAL_TABLET | Freq: Two times a day (BID) | ORAL | 0 refills | Status: AC

## 2019-03-07 NOTE — Telephone Encounter (Signed)
Called patient and informed her that she is not pregnant.  Kathleen Morrow, [place]

## 2019-03-07 NOTE — Patient Instructions (Signed)

## 2019-03-07 NOTE — Assessment & Plan Note (Addendum)
Wet prep consistent with BV. Clue cells and positive whiff test.  Urine pregnancy test negative.  Will give Rx for metronidazole 500 mg twice daily x7 days.  Advised to use condoms for prevention of STDs.  Patient is agreeable to gonorrhea and Chlamydia testing but refuses HIV, RPR, hepatitis C testing.  If no improvement follow-up.  Strict return precautions given.

## 2019-03-07 NOTE — Progress Notes (Signed)
   Subjective:    Patient ID: Kathleen Morrow, female    DOB: 08-Jan-1996, 23 y.o.   MRN: [id number]   CC: vaginal discharge   HPI: VAGINAL DISCHARGE Onset: 2 weeks, changed soaps at that time. Soap was scented.    Description: light, clumpy, white  Odor: fishy    Itching: no   Symptoms Dysuria: no  Bleeding: no  Pelvic pain: no  Back pain: no  Fever: no  Genital sores: no  Rash: no  Dyspareunia: no  GI Sxs: no  Prior treatment: no   Red Flags: Missed period: no  Pregnancy: no  Recent antibiotics: no  Sexual activity: yes, 1 female partner. Patient is on OCP but misses a few days. She is compliant but was having trouble with pharmacy filling her RX Possible STD exposure: yes  IUD: no  Diabetes: no    Objective:  BP 95/70   Pulse 69   Wt 106 lb 6.4 oz (48.3 kg)   LMP 02/25/2019 (Exact Date)   SpO2 99%   BMI 20.78 kg/m  Vitals and nursing note reviewed  General: well nourished, in no acute distress HEENT: normocephalic Cardiac: Regular rate Respiratory: no increased work of breathing Skin: warm and dry, no rashes noted Neuro: alert and oriented, no focal deficits Female genitalia: normal external genitalia, vulva, vagina, cervix, uterus and adnexa  Assessment & Plan:    Bacterial vaginosis Wet prep consistent with BV. Clue cells and positive whiff test.  Urine pregnancy test negative.  Will give Rx for metronidazole 500 mg twice daily x7 days.  Advised to use condoms for prevention of STDs.  Patient is agreeable to gonorrhea and Chlamydia testing but refuses HIV, RPR, hepatitis C testing.  If no improvement follow-up.  Strict return precautions given.    Return if symptoms worsen or fail to improve.   Caroline More, DO, PGY-3

## 2019-03-08 LAB — CERVICOVAGINAL ANCILLARY ONLY
Chlamydia: NEGATIVE
Comment: NEGATIVE
Comment: NORMAL
Neisseria Gonorrhea: NEGATIVE

## 2019-03-09 ENCOUNTER — Telehealth: Payer: Self-pay

## 2019-03-09 NOTE — Telephone Encounter (Signed)
Informed patient of negative results per Dr. Abraham.  .Kathleen Morrow R Marie Chow, CMA  

## 2019-04-18 ENCOUNTER — Ambulatory Visit (INDEPENDENT_AMBULATORY_CARE_PROVIDER_SITE_OTHER): Payer: Medicaid Other | Admitting: Student in an Organized Health Care Education/Training Program

## 2019-04-18 ENCOUNTER — Other Ambulatory Visit: Payer: Self-pay

## 2019-04-18 ENCOUNTER — Other Ambulatory Visit: Payer: Self-pay | Admitting: Student in an Organized Health Care Education/Training Program

## 2019-04-18 ENCOUNTER — Telehealth: Payer: Self-pay | Admitting: Student in an Organized Health Care Education/Training Program

## 2019-04-18 ENCOUNTER — Ambulatory Visit (HOSPITAL_COMMUNITY)
Admission: RE | Admit: 2019-04-18 | Discharge: 2019-04-18 | Disposition: A | Discharge status: Home / Self Care | Payer: Medicaid Other | Source: Ambulatory Visit | Attending: Family Medicine | Admitting: Family Medicine

## 2019-04-18 VITALS — BP 102/60 | HR 76 | Wt 109.4 lb

## 2019-04-18 DIAGNOSIS — Z3A01 Less than 8 weeks gestation of pregnancy: Secondary | ICD-10-CM

## 2019-04-18 DIAGNOSIS — O26891 Other specified pregnancy related conditions, first trimester: Secondary | ICD-10-CM | POA: Diagnosis not present

## 2019-04-18 DIAGNOSIS — Z3201 Encounter for pregnancy test, result positive: Secondary | ICD-10-CM | POA: Insufficient documentation

## 2019-04-18 DIAGNOSIS — R109 Unspecified abdominal pain: Secondary | ICD-10-CM | POA: Diagnosis not present

## 2019-04-18 LAB — POCT URINE PREGNANCY: Preg Test, Ur: POSITIVE — AB

## 2019-04-18 MED ORDER — PRENATAL VITAMIN 27-0.8 MG PO TABS: 1.0000 | ORAL_TABLET | Freq: Every day | ORAL | 1 refills | Status: DC

## 2019-04-18 MED ORDER — DOXYLAMINE SUCCINATE (SLEEP) 25 MG PO TABS: 25.0000 mg | ORAL_TABLET | Freq: Every evening | ORAL | 0 refills | Status: DC | PRN

## 2019-04-18 MED ORDER — VITAMIN B-6 50 MG PO TABS: 50.0000 mg | ORAL_TABLET | Freq: Every day | ORAL | 1 refills | Status: DC

## 2019-04-18 NOTE — Progress Notes (Signed)
Subjective:    Patient ID: Kathleen Morrow, female    DOB: 12-20-1995, 23 y.o.   MRN: [id number]  CC: abdominal pain in pregnancy  HPI:  Several weeks of abdominal pain which is intermittent and described as both epigastric radiating toward navel and LLQ. Sharp and short lived in nature without specific identified trigger. Occurs at rest or with movement. Similar to period cramps. Improves with heating pad. Denies vaginal bleeding, dysuria or discharge. Has recent history of GC/chlamydia which were treated and test of cure prior to pregnancy.  LMP 10/27. Took at home pregnancy test 12/6 which was positive.  Associated with nausea and vomiting. Not taking prenatal vitamin.   H/o 3 previous pregnancies with elective terminations. She desires to keep this pregnancy.  Smoking status reviewed   ROS: pertinent noted in the HPI   Past Medical History:  Diagnosis Date  . Asthma     Past Surgical History:  Procedure Laterality Date  . NO PAST SURGERIES     I have personally reviewed pertinent past medical history, surgical, family, and social history as appropriate. Objective:  BP 102/60   Pulse 76   Wt 109 lb 6.4 oz (49.6 kg)   LMP 03/29/2019   SpO2 99%   BMI 21.37 kg/m   Vitals and nursing note reviewed  General: NAD, pleasant, able to participate in exam Abdomen: soft, non-tender to palpation. Positive for gravid uterus within pelvis which appears appropriate size for gestation. Extremities: no edema or cyanosis. Skin: warm and dry, no rashes noted Neuro: alert, no obvious focal deficits Psych: Normal affect and mood  Assessment & Plan:   Pregnancy Now G4 at approximately 6-7 weeks based on LMP. Last pregnancies were electively terminated. Desires to keep this pregnancy. Concern for ectopic pregnancy given abdominal pain and h/o GC/chlamydia. -vitals are stable -sending for urgent US to r/o ectopic and confirm dates - prescribed anti-nausea meds and prenatals -  obtained prenatal labs and urine which was positive for pregnancy - recommended patient make close follow up appointment for initial OB visit here  Orders Placed This Encounter  Procedures  . Culture, OB Urine  . US OB LESS THAN 14 WEEKS WITH OB TRANSVAGINAL    Standing Status:   Future    Standing Expiration Date:   05/19/2019    Order Specific Question:   Reason for Exam (SYMPTOM  OR DIAGNOSIS REQUIRED)    Answer:   abdominal pain/rule out ectopic/dating    Order Specific Question:   Preferred Imaging Location?    Answer:   [facility]    Order Specific Question:   Call Results- Best Contact Number?    Answer:   [phone] Hold if positive  . Obstetric Panel, Including HIV([facility])  . HGB FRAC. W/SOLUBILITY  . POCT urine pregnancy    Meds ordered this encounter  Medications  . doxylamine, Sleep, (UNISOM) 25 MG tablet    Sig: Take 1 tablet (25 mg total) by mouth at bedtime as needed.    Dispense:  30 tablet    Refill:  0  . pyridOXINE (VITAMIN B-6) 50 MG tablet    Sig: Take 1 tablet (50 mg total) by mouth daily.    Dispense:  30 tablet    Refill:  1  . Prenatal Vit-Fe Fumarate-FA (PRENATAL VITAMIN) 27-0.8 MG TABS    Sig: Take 1 tablet by mouth daily.    Dispense:  90 tablet    Refill:  1    Jamelle Rushing, DO [place]  Family Medicine PGY-2

## 2019-04-18 NOTE — Patient Instructions (Addendum)
It was a pleasure to see you today!  To summarize our discussion for this visit:  We are getting the first labs for your pregnancy today so they can be looked over and discussed during your first pregnancy appointment.   I am going to send you to an ultrasound to make sure we rule out an ectopic pregnancy.   1245 today for ultrasound. Come with a full bladder.  I will send in a medication to help with your nausea and for prenatal vitamin  Doxylamine and vitamin B6 are also available over the counter.  Some additional health maintenance measures we should update are: Health Maintenance Due  Topic Date Due  . TETANUS/TDAP  09/26/2018  . PAP-Cervical Cytology Screening  04/08/2019  . PAP SMEAR-Modifier  04/08/2019  .    Please return to our clinic to see [place] as soon as you can within the next few weeks for your initial OB visit.  Call the clinic at [phone] if your symptoms worsen or you have any concerns.   Thank you for allowing me to take part in your care,  Dr. Doristine Mango   Abdominal Pain During Pregnancy  Belly (abdominal) pain is common during pregnancy. There are many possible causes. Most of the time, it is not a serious problem. Other times, it can be a sign that something is wrong with the pregnancy. Always tell your doctor if you have belly pain. Follow these instructions at home:  Do not have sex or put anything in your vagina until your pain goes away completely.  Get plenty of rest until your pain gets better.  Drink enough fluid to keep your pee (urine) pale yellow.  Take over-the-counter and prescription medicines only as told by your doctor.  Keep all follow-up visits as told by your doctor. This is important. Contact a doctor if:  Your pain continues or gets worse after resting.  You have lower belly pain that: ? Comes and goes at regular times. ? Spreads to your back. ? Feels like menstrual cramps.  You have pain or burning when you pee  (urinate). Get help right away if:  You have a fever or chills.  You have vaginal bleeding.  You are leaking fluid from your vagina.  You are passing tissue from your vagina.  You throw up (vomit) for more than 24 hours.  You have watery poop (diarrhea) for more than 24 hours.  Your baby is moving less than usual.  You feel very weak or faint.  You have shortness of breath.  You have very bad pain in your upper belly. Summary  Belly (abdominal) pain is common during pregnancy. There are many possible causes.  If you have belly pain during pregnancy, tell your doctor right away.  Keep all follow-up visits as told by your doctor. This is important. This information is not intended to replace advice given to you by your health care provider. Make sure you discuss any questions you have with your health care provider. Document Released: 04/07/2009 Document Revised: 08/07/2018 Document Reviewed: 07/22/2016 Elsevier Patient Education  2020 [employer].

## 2019-04-18 NOTE — Telephone Encounter (Signed)
Called patient to discuss results of [place] today. She was relived to hear that the pregnancy is intrauterine.  [place] estimated [redacted]w[redacted]d.  Recommended patient call to set up initial OB visit and provided with clinic number to do so.  She had no other questions at this time.

## 2019-04-18 NOTE — Assessment & Plan Note (Signed)
Now G4 at approximately 6-7 weeks based on LMP. Last pregnancies were electively terminated. Desires to keep this pregnancy. Concern for ectopic pregnancy given abdominal pain and h/o GC/chlamydia. -vitals are stable -sending for urgent US to r/o ectopic and confirm dates - prescribed anti-nausea meds and prenatals - obtained prenatal labs and urine which was positive for pregnancy - recommended patient make close follow up appointment for initial OB visit here

## 2019-04-20 LAB — OBSTETRIC PANEL, INCLUDING HIV
Antibody Screen: NEGATIVE
Basophils Absolute: 0 10*3/uL (ref 0.0–0.2)
Basos: 1 %
EOS (ABSOLUTE): 0.1 10*3/uL (ref 0.0–0.4)
Eos: 1 %
HIV Screen 4th Generation wRfx: NONREACTIVE
Hematocrit: 36.9 % (ref 34.0–46.6)
Hemoglobin: 12 g/dL (ref 11.1–15.9)
Hepatitis B Surface Ag: NEGATIVE
Immature Grans (Abs): 0 10*3/uL (ref 0.0–0.1)
Immature Granulocytes: 0 %
Lymphocytes Absolute: 1.5 10*3/uL (ref 0.7–3.1)
Lymphs: 29 %
MCH: 27.5 pg (ref 26.6–33.0)
MCHC: 32.5 g/dL (ref 31.5–35.7)
MCV: 84 fL (ref 79–97)
Monocytes Absolute: 0.5 10*3/uL (ref 0.1–0.9)
Monocytes: 9 %
Neutrophils Absolute: 3.1 10*3/uL (ref 1.4–7.0)
Neutrophils: 60 %
Platelets: 283 10*3/uL (ref 150–450)
RBC: 4.37 x10E6/uL (ref 3.77–5.28)
RDW: 11.8 % (ref 11.7–15.4)
RPR Ser Ql: NONREACTIVE
Rh Factor: POSITIVE
Rubella Antibodies, IGG: 8.74 index (ref >0.99)
WBC: 5.2 10*3/uL (ref 3.4–10.8)

## 2019-04-20 LAB — HGB FRAC. W/SOLUBILITY
Hgb A2 Quant: 2.2 % (ref 1.8–3.2)
Hgb A: 97.8 % (ref 96.4–98.8)
Hgb C: 0 %
Hgb F Quant: 0 % (ref 0.0–2.0)
Hgb S: 0 %
Hgb Solubility: NEGATIVE
Hgb Variant: 0 %

## 2019-04-20 LAB — CULTURE, OB URINE

## 2019-04-20 LAB — URINE CULTURE, OB REFLEX

## 2019-04-23 ENCOUNTER — Telehealth: Payer: Self-pay | Admitting: Family Medicine

## 2019-04-23 NOTE — Telephone Encounter (Signed)
Will forward to Dr. Ouida Sills who saw patient for last visit.  Farren Landa,CMA

## 2019-04-23 NOTE — Telephone Encounter (Signed)
Pt is returning a missed call from our office today. She said there was no voicemail. I do not see anything documented on who called the patient.   Pt would like for whoever called to call her back. The best call back number is [phone]

## 2019-04-24 ENCOUNTER — Telehealth: Payer: Medicaid Other | Admitting: Family Medicine

## 2019-04-30 ENCOUNTER — Ambulatory Visit (HOSPITAL_COMMUNITY): Payer: Medicaid Other

## 2019-05-04 NOTE — L&D Delivery Note (Signed)
OB/GYN Faculty Practice Delivery Note  Kathleen Morrow is a 24 y.o. G4P0030 s/p vaginal delivery at [redacted]w[redacted]d. She was admitted for post-dates IOL.   ROM: 7h 47m with light meconium stained fluid GBS Status: negative Maximum Maternal Temperature: 97.12F  Labor Progress: On admission, foley bulb and cytotec were placed. Pitocin was started at 0130 s/p expulsion of foley bulb and up-titrated with good fetal tolerance. Cervical dilation complete at 1722 with uncomplicated delivery as noted below.  Delivery Date/Time: 12/15/19 at 1800 Delivery: Called to room and patient was complete and pushing. Head delivered left OA. Body nuchal cord x1 present. Shoulder and body delivered in usual fashion. Infant with spontaneous cry, placed on mother's abdomen, dried and stimulated. Cord clamped x 2 after 1-minute delay, and cut by father of baby under my direct supervision. Cord blood drawn. Placenta delivered spontaneously with gentle cord traction. Fundus firm with massage and Pitocin. Labia, perineum, vagina, and cervix were inspected and notable for bilateral hemostatic periurethral lacerations.  Placenta: intact Complications: none Lacerations: bilateral hemostatic periurethral lacerations w/o repair EBL: 100 ml Analgesia: epidural  Infant: female  APGARs 9 & 9 at 1 and 5 minutes respectively  weight pending  Lynnda Shields, MD OB/GYN Fellow, Faculty Practice

## 2019-05-09 ENCOUNTER — Other Ambulatory Visit (HOSPITAL_COMMUNITY)
Admission: RE | Admit: 2019-05-09 | Discharge: 2019-05-09 | Disposition: A | Discharge status: Home / Self Care | Payer: Medicaid Other | Source: Ambulatory Visit | Attending: Family Medicine | Admitting: Family Medicine

## 2019-05-09 ENCOUNTER — Ambulatory Visit (INDEPENDENT_AMBULATORY_CARE_PROVIDER_SITE_OTHER): Payer: Medicaid Other | Admitting: Student in an Organized Health Care Education/Training Program

## 2019-05-09 ENCOUNTER — Other Ambulatory Visit: Payer: Self-pay

## 2019-05-09 ENCOUNTER — Encounter: Payer: Self-pay | Admitting: Student in an Organized Health Care Education/Training Program

## 2019-05-09 VITALS — BP 110/60 | HR 76 | Wt 109.2 lb

## 2019-05-09 DIAGNOSIS — Z3201 Encounter for pregnancy test, result positive: Secondary | ICD-10-CM

## 2019-05-09 DIAGNOSIS — Z3A09 9 weeks gestation of pregnancy: Secondary | ICD-10-CM

## 2019-05-09 NOTE — Patient Instructions (Addendum)
It was a pleasure to see you today!  To summarize our discussion for this visit:  Your pregnancy seems to be going well. I'm sorry to hear that you're still experiencing nausea and constipation.   Something you can try for the nausea on top of what we discussed in your appointment: Doxylamine    Two tablets at bedtime on day 1 and 2; if symptoms persist, take 1 tablet in morning and 2 tablets at bedtime on day 3; if symptoms persist, may increase to 1 tablet in morning, 1 tablet mid-afternoon, and 2 tablets at bedtime on day 4 (maximum: doxylamine 40 mg/pyridoxine 40 mg (4 tablets) per day).  -We will schedule your genetic screening after you are [redacted] weeks gestational age.    Some additional health maintenance measures we should update are: Health Maintenance Due  Topic Date Due  . TETANUS/TDAP  09/26/2018  . PAP-Cervical Cytology Screening  04/08/2019  . PAP SMEAR-Modifier  04/08/2019  .   Please return to our clinic to see me in 4 weeks.  Call the clinic at [phone] if your symptoms worsen or you have any concerns.   Thank you for allowing me to take part in your care,  Dr. Doristine Mango    First Trimester of Pregnancy  The first trimester of pregnancy is from week 1 until the end of week 13 (months 1 through 3). During this time, your baby will begin to develop inside you. At 6-8 weeks, the eyes and face are formed, and the heartbeat can be seen on ultrasound. At the end of 12 weeks, all the baby's organs are formed. Prenatal care is all the medical care you receive before the birth of your baby. Make sure you get good prenatal care and follow all of your doctor's instructions. Follow these instructions at home: Medicines  Take over-the-counter and prescription medicines only as told by your doctor. Some medicines are safe and some medicines are not safe during pregnancy.  Take a prenatal vitamin that contains at least 600 micrograms (mcg) of folic acid.  If you  have trouble pooping (constipation), take medicine that will make your stool soft (stool softener) if your doctor approves. Eating and drinking   Eat regular, healthy meals.  Your doctor will tell you the amount of weight gain that is right for you.  Avoid raw meat and uncooked cheese.  If you feel sick to your stomach (nauseous) or throw up (vomit): ? Eat 4 or 5 small meals a day instead of 3 large meals. ? Try eating a few soda crackers. ? Drink liquids between meals instead of during meals.  To prevent constipation: ? Eat foods that are high in fiber, like fresh fruits and vegetables, whole grains, and beans. ? Drink enough fluids to keep your pee (urine) clear or pale yellow. Activity  Exercise only as told by your doctor. Stop exercising if you have cramps or pain in your lower belly (abdomen) or low back.  Do not exercise if it is too hot, too humid, or if you are in a place of great height (high altitude).  Try to avoid standing for long periods of time. Move your legs often if you must stand in one place for a long time.  Avoid heavy lifting.  Wear low-heeled shoes. Sit and stand up straight.  You can have sex unless your doctor tells you not to. Relieving pain and discomfort  Wear a good support bra if your breasts are sore.  Take warm water  baths (sitz baths) to soothe pain or discomfort caused by hemorrhoids. Use hemorrhoid cream if your doctor says it is okay.  Rest with your legs raised if you have leg cramps or low back pain.  If you have puffy, bulging veins (varicose veins) in your legs: ? Wear support hose or compression stockings as told by your doctor. ? Raise (elevate) your feet for 15 minutes, 3-4 times a day. ? Limit salt in your food. Prenatal care  Schedule your prenatal visits by the twelfth week of pregnancy.  Write down your questions. Take them to your prenatal visits.  Keep all your prenatal visits as told by your doctor. This is  important. Safety  Wear your seat belt at all times when driving.  Make a list of emergency phone numbers. The list should include numbers for family, friends, the hospital, and police and fire departments. General instructions  Ask your doctor for a referral to a local prenatal class. Begin classes no later than at the start of month 6 of your pregnancy.  Ask for help if you need counseling or if you need help with nutrition. Your doctor can give you advice or tell you where to go for help.  Do not use hot tubs, steam rooms, or saunas.  Do not douche or use tampons or scented sanitary pads.  Do not cross your legs for long periods of time.  Avoid all herbs and alcohol. Avoid drugs that are not approved by your doctor.  Do not use any tobacco products, including cigarettes, chewing tobacco, and electronic cigarettes. If you need help quitting, ask your doctor. You may get counseling or other support to help you quit.  Avoid cat litter boxes and soil used by cats. These carry germs that can cause birth defects in the baby and can cause a loss of your baby (miscarriage) or stillbirth.  Visit your dentist. At home, brush your teeth with a soft toothbrush. Be gentle when you floss. Contact a doctor if:  You are dizzy.  You have mild cramps or pressure in your lower belly.  You have a nagging pain in your belly area.  You continue to feel sick to your stomach, you throw up, or you have watery poop (diarrhea).  You have a bad smelling fluid coming from your vagina.  You have pain when you pee (urinate).  You have increased puffiness (swelling) in your face, hands, legs, or ankles. Get help right away if:  You have a fever.  You are leaking fluid from your vagina.  You have spotting or bleeding from your vagina.  You have very bad belly cramping or pain.  You gain or lose weight rapidly.  You throw up blood. It may look like coffee grounds.  You are around people who  have [place] measles, fifth disease, or chickenpox.  You have a very bad headache.  You have shortness of breath.  You have any kind of trauma, such as from a fall or a car accident. Summary  The first trimester of pregnancy is from week 1 until the end of week 13 (months 1 through 3).  To take care of yourself and your unborn baby, you will need to eat healthy meals, take medicines only if your doctor tells you to do so, and do activities that are safe for you and your baby.  Keep all follow-up visits as told by your doctor. This is important as your doctor will have to ensure that your baby is healthy and  growing well. This information is not intended to replace advice given to you by your health care provider. Make sure you discuss any questions you have with your health care provider. Document Revised: 08/10/2018 Document Reviewed: 04/27/2016 Elsevier Patient Education  2020 [employer].
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
## 2019-05-10 ENCOUNTER — Telehealth: Payer: Self-pay | Admitting: *Deleted

## 2019-05-10 ENCOUNTER — Other Ambulatory Visit: Payer: Self-pay | Admitting: Student in an Organized Health Care Education/Training Program

## 2019-05-10 MED ORDER — DOXYLAMINE-PYRIDOXINE ER 20-20 MG PO TBCR: 1.0000 | EXTENDED_RELEASE_TABLET | Freq: Two times a day (BID) | ORAL | 0 refills | Status: DC | PRN

## 2019-05-10 NOTE — Telephone Encounter (Signed)
Please let the patient know that I will send in a prescription now since she's requesting that, but as I told her in our appointment, this medication is available OTC so I was not planning to prescribe to her.Marland Kitchen

## 2019-05-10 NOTE — Telephone Encounter (Signed)
Pt calls because doxylamine 40 mg/pyridoxine 40 mg was never called into the pharmacy.  To Dr. Dareen Piano.  Jone Baseman, CMA

## 2019-05-10 NOTE — Telephone Encounter (Signed)
LMOVM informing patient. Gillis Boardley, CMA ? ?

## 2019-05-11 LAB — CYTOLOGY - PAP
Chlamydia: NEGATIVE
Comment: NEGATIVE
Comment: NEGATIVE
Comment: NORMAL
Diagnosis: NEGATIVE
High risk HPV: NEGATIVE
Neisseria Gonorrhea: NEGATIVE

## 2019-05-11 NOTE — Telephone Encounter (Signed)
Received PA for below medication. Medicaid will not cover as it is OTC as previously stated by PCP. I left a VM for patient to call back to inform her she can get OTC.

## 2019-05-11 NOTE — Telephone Encounter (Signed)
Informed patient that insurance would not cover medication due to medication being OTC and she could pick up from local pharmacy. Pt verbalized understanding.   Veronda Prude, RN

## 2019-05-31 ENCOUNTER — Ambulatory Visit (HOSPITAL_COMMUNITY): Payer: Medicaid Other | Admitting: *Deleted

## 2019-05-31 ENCOUNTER — Encounter (HOSPITAL_COMMUNITY): Payer: Self-pay

## 2019-05-31 ENCOUNTER — Ambulatory Visit (HOSPITAL_COMMUNITY)
Admission: RE | Admit: 2019-05-31 | Discharge: 2019-05-31 | Disposition: A | Discharge status: Home / Self Care | Payer: Medicaid Other | Source: Ambulatory Visit | Attending: Obstetrics and Gynecology | Admitting: Obstetrics and Gynecology

## 2019-05-31 ENCOUNTER — Other Ambulatory Visit: Payer: Self-pay

## 2019-05-31 ENCOUNTER — Ambulatory Visit (HOSPITAL_COMMUNITY): Payer: Medicaid Other

## 2019-05-31 VITALS — BP 95/60 | HR 74 | Temp 97.3°F | Wt 103.8 lb

## 2019-05-31 DIAGNOSIS — Z3682 Encounter for antenatal screening for nuchal translucency: Secondary | ICD-10-CM

## 2019-05-31 DIAGNOSIS — Z3A12 12 weeks gestation of pregnancy: Secondary | ICD-10-CM

## 2019-05-31 DIAGNOSIS — Z3201 Encounter for pregnancy test, result positive: Secondary | ICD-10-CM | POA: Insufficient documentation

## 2019-06-02 LAB — FIRST TRIMESTER SCREEN W/NT
CRL: 81.2 mm
DIA MoM: 0.84
DIA Value: 210.1 pg/mL
Gest Age-Collect: 13.7 weeks
Maternal Age At EDD: 21.7 yr
Nuchal Translucency MoM: 0.92
Nuchal Translucency: 2.2 mm
Number of Fetuses: 1
PAPP-A MoM: 1.21
PAPP-A Value: 2670 ng/mL
Test Results:: NEGATIVE
Weight: 103 [lb_av]
hCG MoM: 0.38
hCG Value: 37.4 IU/mL

## 2019-06-04 ENCOUNTER — Telehealth (HOSPITAL_COMMUNITY): Payer: Self-pay | Admitting: Genetic Counselor

## 2019-06-04 NOTE — Telephone Encounter (Signed)
I called Ms. Kathleen Morrow to discuss her negative first trimester screen results. We reviewed that the risk for her pregnancy to be affected by Down syndrome decreased from her 1 in 1021 age-related risk to less than 1 in 10,000, and the risk for trisomy 18 decreased from her 1 in 1999 age-related risk to less than 1 in 10,000 based on the results of this screen. Ms. Kathleen Morrow was reminded that while this screen significantly reduces the likelihood of the pregnancy being affected by trisomy 38 or trisomy 76, it cannot be considered diagnostic. Diagnostic testing via CV[facility] or amniocentesis is available should she be interested in pursuing this. Additionally, first trimester screening does not screen for open neural tube defects such as spina bifida. It is recommended that MS-AFP screening be ordered around 16-18 weeks to screen for this. Ms. Kathleen Morrow confirmed that she had no questions about these results.  Gershon Crane, MS Genetic Counselor

## 2019-06-13 ENCOUNTER — Telehealth: Payer: Self-pay | Admitting: *Deleted

## 2019-06-13 NOTE — Telephone Encounter (Signed)
Pt scheduled to see dr Pollie Meyer on Monday. Amyre Segundo Bruna Potter, CMA

## 2019-06-13 NOTE — Telephone Encounter (Signed)
-----   Message from Westley Chandler, MD sent at 06/07/2019  5:11 PM EST ----- Regarding: Prenatal Hi Red Team,  Please call this patient and help arrange follow up for her prenatal care. Should be seen soon---she was last seen ~ 6 weeks ago.   Thanks! Carina

## 2019-06-18 ENCOUNTER — Other Ambulatory Visit: Payer: Self-pay

## 2019-06-18 ENCOUNTER — Ambulatory Visit (INDEPENDENT_AMBULATORY_CARE_PROVIDER_SITE_OTHER): Payer: Medicaid Other | Admitting: Family Medicine

## 2019-06-18 VITALS — BP 112/60 | HR 98 | Wt 108.4 lb

## 2019-06-18 DIAGNOSIS — Z3201 Encounter for pregnancy test, result positive: Secondary | ICD-10-CM

## 2019-06-18 DIAGNOSIS — O99320 Drug use complicating pregnancy, unspecified trimester: Secondary | ICD-10-CM

## 2019-06-18 DIAGNOSIS — Z3492 Encounter for supervision of normal pregnancy, unspecified, second trimester: Secondary | ICD-10-CM

## 2019-06-18 LAB — POCT URINE DRUG SCREEN
Buprenorphine, Urine: NOT DETECTED
POC Amphetamine UR: NOT DETECTED
POC BENZODIAZEPINES UR: NOT DETECTED
POC Barbiturate UR: NOT DETECTED
POC Cocaine UR: NOT DETECTED
POC Ecstasy UR: NOT DETECTED
POC Marijuana UR: POSITIVE — AB
POC Methadone UR: NOT DETECTED
POC Methamphetamine UR: NOT DETECTED
POC Opiate Ur: NOT DETECTED
POC Oxycodone UR: NOT DETECTED
POC PH URINE: NORMAL
POC PHENCYCLIDINE UR: NOT DETECTED
POC SPECIFIC GRAVITY URINE: NORMAL
URINE TEMPERATURE: 92 Degrees F (ref 90.0–100.0)

## 2019-06-18 NOTE — Addendum Note (Signed)
Addended by: Jennette Bill on: 06/18/2019 04:37 PM   Modules accepted: Orders

## 2019-06-18 NOTE — Progress Notes (Signed)
Kathleen Morrow is a 24 y.o. G4P0030 at [redacted]w[redacted]d here for routine follow up. She is dated by early ultrasound.  She reports no complaints.  She denies vaginal bleeding, pelvic pain, or LOF. Taking doxylamine at night with good relief of nausea. Does still feel tired. See flow sheet for details.  FHR 142bpm  A/P: Pregnancy at [redacted]w[redacted]d.  Doing well.   Routine prenatal care:  - anatomy scan ordered & scheduled - first trimester screen and nuchal translucency u/s test normal - repeat OB urine culture today (grew lactobacillus before) - continue PNV, doxylamine - reviewed bleeding & pain precautions - next visit in 4 weeks  Marijuana use: - Patient declined using THC during today's visit, however room smelled of marijuana. POC UDS performed on her urine sample and is positive for THC.  - will plan to contact patient via phone to discuss result and recommend THC cessation given potential impact on pregnancy.  Levert Feinstein, MD [facility]

## 2019-06-18 NOTE — Patient Instructions (Addendum)
It was great to see you again today!  Go to the MAU at [facility] if:  You have pain in your lower abdomen or pelvic area  Your water breaks.  Sometimes it is a big gush of fluid, sometimes it is just a trickle that keeps getting your underwear wet or running down your legs  You have vaginal bleeding.  Scheduling anatomy ultrasound Next visit here with Dr. Dareen Piano in 4 weeks  Be well, Dr. Pollie Meyer

## 2019-06-21 LAB — CULTURE, OB URINE

## 2019-06-21 LAB — URINE CULTURE, OB REFLEX: Organism ID, Bacteria: NO GROWTH

## 2019-06-22 ENCOUNTER — Telehealth: Payer: Self-pay | Admitting: Family Medicine

## 2019-06-22 NOTE — Telephone Encounter (Signed)
Called patient and informed her of negative OB urine culture Also discussed +THC in urine. Advised of potential negative neurodevelopmental outcomes for babies whose mothers use MJ during pregnancy. Also explained that she will get UDS at delivery and baby will have UDS and cord blood drug testing, and if these are positive CPS will be involved. Encouraged her to avoid THC and all drugs while pregnant. Patient appreciative.  Latrelle Dodrill, MD

## 2019-07-13 ENCOUNTER — Ambulatory Visit (HOSPITAL_COMMUNITY)
Admission: RE | Admit: 2019-07-13 | Discharge: 2019-07-13 | Disposition: A | Discharge status: Home / Self Care | Payer: Medicaid Other | Source: Ambulatory Visit | Attending: Obstetrics and Gynecology | Admitting: Obstetrics and Gynecology

## 2019-07-13 ENCOUNTER — Telehealth: Payer: Self-pay | Admitting: Family Medicine

## 2019-07-13 ENCOUNTER — Other Ambulatory Visit: Payer: Self-pay

## 2019-07-13 DIAGNOSIS — Z3492 Encounter for supervision of normal pregnancy, unspecified, second trimester: Secondary | ICD-10-CM | POA: Diagnosis not present

## 2019-07-13 DIAGNOSIS — Z3A19 19 weeks gestation of pregnancy: Secondary | ICD-10-CM | POA: Diagnosis not present

## 2019-07-13 NOTE — Telephone Encounter (Signed)
Called patient & informed of normal u/s. Advised needs growth u/s at 32w. Updated sticky note and problem list.  Follow up visit scheduled for Monday with Dr. Salvadore Dom as Dr. Dareen Piano, Dr. Lum Babe, and myself are all booked.  Patient appreciative. Latrelle Dodrill, MD

## 2019-07-16 ENCOUNTER — Encounter: Payer: Medicaid Other | Admitting: Family Medicine

## 2019-07-16 NOTE — Progress Notes (Deleted)
Kathleen Morrow is a 24 y.o. G4P0030 at [redacted]w[redacted]d here for routine follow up. She is dated by {Ob dating:14516}.  She reports {symptoms; pregnancy related:14538}.  She denies vaginal bleeding.  See flow sheet for details.  There were no vitals filed for this visit.   A/P: Pregnancy at [redacted]w[redacted]d.  Doing well.   . Dating reviewed, dating tab is {correct:23336::"correct"} . Fetal heart tones {appropriate:23337} . Pregnancy issues include ***. . Problem list updated: {yes/no:20286::"Yes"}.  . Influenza vaccine {given:23340}  . The patient has the following indication for screening preexisting diabetes: {Pre-existing diabetes screening:23343::"Reviewed indications for early 1 hour glucose testing, not indicated "}. . Anatomy ultrasound ordered to be scheduled at 18-20 weeks. . Patient {is/is not:9024} interested in genetic screening. As she is past 13 weeks and 6 days, a {quad:23339::"Quad screen "} was offered.  . Pregnancy education including expected weight gain in pregnancy, OTC medication use, continued use of prenatal vitamin, smoking cessation if applicable, and nutrition in pregnancy.   . Bleeding and pain precautions reviewed. . Follow up 4 weeks.

## 2019-08-11 DIAGNOSIS — R1084 Generalized abdominal pain: Secondary | ICD-10-CM | POA: Diagnosis not present

## 2019-08-11 DIAGNOSIS — O26892 Other specified pregnancy related conditions, second trimester: Secondary | ICD-10-CM | POA: Diagnosis not present

## 2019-08-11 DIAGNOSIS — R52 Pain, unspecified: Secondary | ICD-10-CM | POA: Diagnosis not present

## 2019-08-11 DIAGNOSIS — Z3A23 23 weeks gestation of pregnancy: Secondary | ICD-10-CM | POA: Diagnosis not present

## 2019-10-23 ENCOUNTER — Ambulatory Visit (INDEPENDENT_AMBULATORY_CARE_PROVIDER_SITE_OTHER): Payer: Medicaid Other | Admitting: Family Medicine

## 2019-10-23 ENCOUNTER — Other Ambulatory Visit: Payer: Self-pay

## 2019-10-23 ENCOUNTER — Other Ambulatory Visit (HOSPITAL_COMMUNITY)
Admission: RE | Admit: 2019-10-23 | Discharge: 2019-10-23 | Disposition: A | Discharge status: Home / Self Care | Payer: Medicaid Other | Source: Ambulatory Visit | Attending: Family Medicine | Admitting: Family Medicine

## 2019-10-23 VITALS — BP 100/60 | HR 74 | Wt 138.0 lb

## 2019-10-23 DIAGNOSIS — Z113 Encounter for screening for infections with a predominantly sexual mode of transmission: Secondary | ICD-10-CM | POA: Diagnosis not present

## 2019-10-23 DIAGNOSIS — Z114 Encounter for screening for human immunodeficiency virus [HIV]: Secondary | ICD-10-CM

## 2019-10-23 DIAGNOSIS — O219 Vomiting of pregnancy, unspecified: Secondary | ICD-10-CM | POA: Diagnosis not present

## 2019-10-23 DIAGNOSIS — Z3493 Encounter for supervision of normal pregnancy, unspecified, third trimester: Secondary | ICD-10-CM

## 2019-10-23 DIAGNOSIS — Z3483 Encounter for supervision of other normal pregnancy, third trimester: Secondary | ICD-10-CM | POA: Diagnosis not present

## 2019-10-23 DIAGNOSIS — Z23 Encounter for immunization: Secondary | ICD-10-CM | POA: Diagnosis not present

## 2019-10-23 DIAGNOSIS — Z3A33 33 weeks gestation of pregnancy: Secondary | ICD-10-CM | POA: Diagnosis not present

## 2019-10-23 HISTORY — DX: Encounter for supervision of normal pregnancy, unspecified, third trimester: Z34.93

## 2019-10-23 LAB — POCT 1 HR PRENATAL GLUCOSE: Glucose 1 Hr Prenatal, POC: 133 mg/dL

## 2019-10-23 MED ORDER — DOXYLAMINE-PYRIDOXINE 10-10 MG PO TBEC: DELAYED_RELEASE_TABLET | ORAL | 0 refills | Status: DC

## 2019-10-23 NOTE — Patient Instructions (Addendum)
Third Trimester of Pregnancy  The third trimester is from week 28 through week 40 (months 7 through 9). This trimester is when your unborn baby (fetus) is growing very fast. At the end of the ninth month, the unborn baby is about 20 inches in length. It weighs about 6-10 pounds. Follow these instructions at home: Medicines  Take over-the-counter and prescription medicines only as told by your doctor. Some medicines are safe and some medicines are not safe during pregnancy.  Take a prenatal vitamin that contains at least 600 micrograms (mcg) of folic acid.  If you have trouble pooping (constipation), take medicine that will make your stool soft (stool softener) if your doctor approves. Eating and drinking   Eat regular, healthy meals.  Avoid raw meat and uncooked cheese.  If you get low calcium from the food you eat, talk to your doctor about taking a daily calcium supplement.  Eat four or five small meals rather than three large meals a day.  Avoid foods that are high in fat and sugars, such as fried and sweet foods.  To prevent constipation: ? Eat foods that are high in fiber, like fresh fruits and vegetables, whole grains, and beans. ? Drink enough fluids to keep your pee (urine) clear or pale yellow. Activity  Exercise only as told by your doctor. Stop exercising if you start to have cramps.  Avoid heavy lifting, wear low heels, and sit up straight.  Do not exercise if it is too hot, too humid, or if you are in a place of great height (high altitude).  You may continue to have sex unless your doctor tells you not to. Relieving pain and discomfort  Wear a good support bra if your breasts are tender.  Take frequent breaks and rest with your legs raised if you have leg cramps or low back pain.  Take warm water baths (sitz baths) to soothe pain or discomfort caused by hemorrhoids. Use hemorrhoid cream if your doctor approves.  If you develop puffy, bulging veins (varicose  veins) in your legs: ? Wear support hose or compression stockings as told by your doctor. ? Raise (elevate) your feet for 15 minutes, 3-4 times a day. ? Limit salt in your food. Safety  Wear your seat belt when driving.  Make a list of emergency phone numbers, including numbers for family, friends, the hospital, and police and fire departments. Preparing for your baby's arrival To prepare for the arrival of your baby:  Take prenatal classes.  Practice driving to the hospital.  Visit the hospital and tour the maternity area.  Talk to your work about taking leave once the baby comes.  Pack your hospital bag.  Prepare the baby's room.  Go to your doctor visits.  Buy a rear-facing car seat. Learn how to install it in your car. General instructions  Do not use hot tubs, steam rooms, or saunas.  Do not use any products that contain nicotine or tobacco, such as cigarettes and e-cigarettes. If you need help quitting, ask your doctor.  Do not drink alcohol.  Do not douche or use tampons or scented sanitary pads.  Do not cross your legs for long periods of time.  Do not travel for long distances unless you must. Only do so if your doctor says it is okay.  Visit your dentist if you have not gone during your pregnancy. Use a soft toothbrush to brush your teeth. Be gentle when you floss.  Avoid cat litter boxes and soil  used by cats. These carry germs that can cause birth defects in the baby and can cause a loss of your baby (miscarriage) or stillbirth.  Keep all your prenatal visits as told by your doctor. This is important. Contact a doctor if:  You are not sure if you are in labor or if your water has broken.  You are dizzy.  You have mild cramps or pressure in your lower belly.  You have a nagging pain in your belly area.  You continue to feel sick to your stomach, you throw up, or you have watery poop.  You have bad smelling fluid coming from your vagina.  You have  pain when you pee. Get help right away if:  You have a fever.  You are leaking fluid from your vagina.  You are spotting or bleeding from your vagina.  You have severe belly cramps or pain.  You lose or gain weight quickly.  You have trouble catching your breath and have chest pain.  You notice sudden or extreme puffiness (swelling) of your face, hands, ankles, feet, or legs.  You have not felt the baby move in over an hour.  You have severe headaches that do not go away with medicine.  You have trouble seeing.  You are leaking, or you are having a gush of fluid, from your vagina before you are 37 weeks.  You have regular belly spasms (contractions) before you are 37 weeks. Summary  The third trimester is from week 28 through week 40 (months 7 through 9). This time is when your unborn baby is growing very fast.  Follow your doctor's advice about medicine, food, and activity.  Get ready for the arrival of your baby by taking prenatal classes, getting all the baby items ready, preparing the baby's room, and visiting your doctor to be checked.  Get help right away if you are bleeding from your vagina, or you have chest pain and trouble catching your breath, or if you have not felt your baby move in over an hour. This information is not intended to replace advice given to you by your health care provider. Make sure you discuss any questions you have with your health care provider. Document Revised: 08/10/2018 Document Reviewed: 05/25/2016 Elsevier Patient Education  2020 Elsevier Inc.  How to take nausea medicine: 2 tablets taken at bedtime (Day 1). If symptoms are controlled the following day, continue taking 2 tablets at bedtime. If symptoms persist in the afternoon of Day 2, take 2 tablets at bedtime and start taking 3 tablets on Day 3 (1 tablet in the morning and 2 tablets at bedtime). If symptoms are controlled on Day 4, continue taking 3 tablets daily. Otherwise, take 1  tablet in the morning, 1 tablet in the mid-afternoon, and 2 tablets at bedtime (total of 4 tablets).

## 2019-10-23 NOTE — Progress Notes (Signed)
  [facility] Prenatal Visit  Kathleen Morrow is a 24 y.o. G4P0030 at [redacted]w[redacted]d here for routine follow up. Kathleen Morrow is dated by early ultrasound.  Kathleen Morrow reports contractions since 08/11/19 was seen at [facility] for abdominal pain. Given unisom for nausea that helps her sleep but does not work for her nausea. Kathleen Morrow reports fetal movement. Kathleen Morrow denies vaginal bleeding, contractions, or loss of fluid.  See flow sheet for details.  Vitals:   10/23/19 1442  BP: 100/60  Pulse: 74   A/P: Pregnancy at [redacted]w[redacted]d.  Doing well.   1. Routine prenatal care:  Marland Kitchen Dating reviewed, dating tab is correct . Fetal heart tones: Appropriate . Fundal height: >2 cm from expected size given dating, discussed with preceptor.  Growth [place] scheduled for 10/25/19 @ 2:15pm.   Unable to be determined at this time but fetus does not appear to be vertex. . The patient does not have a history of HSV and valacyclovir is not indicated at this time.  . The patient does not have a history of Cesarean delivery and no referral to [facility] is indicated . Infant feeding choice: Formula feeding  . Contraception choice: Depo-Provera . Infant circumcision desired yes . Influenza vaccine not administered as not influenza season.   . Tdap was given today. . 1hr gtt completed today. . Repeat GC/Ch, CBC, RPR, HIV . Childbirth and education classes were offered. . Pregnancy education regarding benefits of breastfeeding, contraception, fetal growth, expected weight gain, and safe infant sleep were discussed.  . Preterm labor and fetal movement precautions reviewed.  2. Pregnancy issues include the following and were addressed as appropriate today: . THC use during pregnancy: +UDS  Nausea affecting pregnancy: Diclegis sent to pharmacy . Problem list and pregnancy box updated: Yes.   Scheduled for Ob Faculty clinic in third trimester on 11/08/19.   Follow up 2 weeks or sooner if needed.

## 2019-10-24 ENCOUNTER — Telehealth: Payer: Self-pay | Admitting: Family Medicine

## 2019-10-24 DIAGNOSIS — O99013 Anemia complicating pregnancy, third trimester: Secondary | ICD-10-CM

## 2019-10-24 LAB — CBC
Hematocrit: 28.2 % — ABNORMAL LOW (ref 34.0–46.6)
Hemoglobin: 9.3 g/dL — ABNORMAL LOW (ref 11.1–15.9)
MCH: 25.8 pg — ABNORMAL LOW (ref 26.6–33.0)
MCHC: 33 g/dL (ref 31.5–35.7)
MCV: 78 fL — ABNORMAL LOW (ref 79–97)
Platelets: 280 10*3/uL (ref 150–450)
RBC: 3.6 x10E6/uL — ABNORMAL LOW (ref 3.77–5.28)
RDW: 12.3 % (ref 11.7–15.4)
WBC: 8.8 10*3/uL (ref 3.4–10.8)

## 2019-10-24 LAB — HIV ANTIBODY (ROUTINE TESTING W REFLEX): HIV Screen 4th Generation wRfx: NONREACTIVE

## 2019-10-24 LAB — RPR: RPR Ser Ql: NONREACTIVE

## 2019-10-24 MED ORDER — IRON 325 (65 FE) MG PO TABS: 1.0000 | ORAL_TABLET | Freq: Every day | ORAL | 3 refills | Status: DC

## 2019-10-24 NOTE — Telephone Encounter (Signed)
Called patient confirming name and date of birth. Informed patient that she is anemic and suggest taking daily iron. Will send in to her pharmacy. Asked patient to let us know if she becomes constipated with taking daily iron. She voiced understanding and will pick up prescription today.   Dr. Salvadore Dom

## 2019-10-25 ENCOUNTER — Other Ambulatory Visit: Payer: Self-pay

## 2019-10-25 ENCOUNTER — Ambulatory Visit: Payer: Medicaid Other | Attending: Obstetrics and Gynecology

## 2019-10-25 ENCOUNTER — Ambulatory Visit: Payer: Medicaid Other | Admitting: *Deleted

## 2019-10-25 VITALS — BP 104/57 | HR 96

## 2019-10-25 DIAGNOSIS — F1290 Cannabis use, unspecified, uncomplicated: Secondary | ICD-10-CM | POA: Diagnosis not present

## 2019-10-25 DIAGNOSIS — J45909 Unspecified asthma, uncomplicated: Secondary | ICD-10-CM | POA: Diagnosis not present

## 2019-10-25 DIAGNOSIS — O99513 Diseases of the respiratory system complicating pregnancy, third trimester: Secondary | ICD-10-CM

## 2019-10-25 DIAGNOSIS — Z3A33 33 weeks gestation of pregnancy: Secondary | ICD-10-CM

## 2019-10-25 DIAGNOSIS — Z3493 Encounter for supervision of normal pregnancy, unspecified, third trimester: Secondary | ICD-10-CM

## 2019-10-25 DIAGNOSIS — O99323 Drug use complicating pregnancy, third trimester: Secondary | ICD-10-CM | POA: Insufficient documentation

## 2019-10-25 DIAGNOSIS — O99320 Drug use complicating pregnancy, unspecified trimester: Secondary | ICD-10-CM

## 2019-10-25 DIAGNOSIS — F1920 Other psychoactive substance dependence, uncomplicated: Secondary | ICD-10-CM

## 2019-10-25 DIAGNOSIS — Z3689 Encounter for other specified antenatal screening: Secondary | ICD-10-CM | POA: Diagnosis not present

## 2019-10-25 DIAGNOSIS — Z362 Encounter for other antenatal screening follow-up: Secondary | ICD-10-CM

## 2019-10-25 LAB — URINE CYTOLOGY ANCILLARY ONLY
Chlamydia: NEGATIVE
Comment: NEGATIVE
Comment: NORMAL
Neisseria Gonorrhea: NEGATIVE

## 2019-10-30 ENCOUNTER — Encounter: Payer: Self-pay | Admitting: Family Medicine

## 2019-11-08 ENCOUNTER — Ambulatory Visit: Payer: Medicaid Other | Admitting: Family Medicine

## 2019-11-09 NOTE — Progress Notes (Signed)
Error

## 2019-11-12 ENCOUNTER — Telehealth: Payer: Self-pay | Admitting: Family Medicine

## 2019-11-12 ENCOUNTER — Encounter: Payer: Medicaid Other | Admitting: Family Medicine

## 2019-11-12 NOTE — Telephone Encounter (Signed)
Called patient regarding her missed appointment.  She did not answer so I left a voicemail asking her to please call the family medicine clinic to reschedule her visit.

## 2019-11-14 ENCOUNTER — Other Ambulatory Visit: Payer: Self-pay

## 2019-11-14 DIAGNOSIS — O219 Vomiting of pregnancy, unspecified: Secondary | ICD-10-CM

## 2019-11-15 ENCOUNTER — Other Ambulatory Visit: Payer: Self-pay | Admitting: Family Medicine

## 2019-11-15 DIAGNOSIS — O219 Vomiting of pregnancy, unspecified: Secondary | ICD-10-CM

## 2019-11-15 MED ORDER — DOXYLAMINE-PYRIDOXINE 10-10 MG PO TBEC: DELAYED_RELEASE_TABLET | ORAL | 0 refills | Status: DC

## 2019-11-15 NOTE — Telephone Encounter (Signed)
I called the pharmacy to clarify prn instruction for Doxylamine. Initial refill without prn instruction deleted. Confirmed by Chad, the pharmacist.

## 2019-11-26 ENCOUNTER — Encounter (HOSPITAL_COMMUNITY): Payer: Self-pay

## 2019-11-26 ENCOUNTER — Other Ambulatory Visit: Payer: Self-pay

## 2019-11-26 ENCOUNTER — Emergency Department (HOSPITAL_COMMUNITY)
Admission: EM | Admit: 2019-11-26 | Discharge: 2019-11-27 | Disposition: A | Discharge status: Home / Self Care | Payer: Medicaid Other | Attending: Emergency Medicine | Admitting: Emergency Medicine

## 2019-11-26 DIAGNOSIS — Z20822 Contact with and (suspected) exposure to covid-19: Secondary | ICD-10-CM | POA: Insufficient documentation

## 2019-11-26 DIAGNOSIS — Z79899 Other long term (current) drug therapy: Secondary | ICD-10-CM | POA: Diagnosis not present

## 2019-11-26 DIAGNOSIS — K920 Hematemesis: Secondary | ICD-10-CM | POA: Diagnosis not present

## 2019-11-26 DIAGNOSIS — K2901 Acute gastritis with bleeding: Secondary | ICD-10-CM | POA: Diagnosis not present

## 2019-11-26 DIAGNOSIS — K2900 Acute gastritis without bleeding: Secondary | ICD-10-CM | POA: Diagnosis not present

## 2019-11-26 DIAGNOSIS — Z87891 Personal history of nicotine dependence: Secondary | ICD-10-CM | POA: Insufficient documentation

## 2019-11-26 DIAGNOSIS — J45909 Unspecified asthma, uncomplicated: Secondary | ICD-10-CM | POA: Insufficient documentation

## 2019-11-26 DIAGNOSIS — O26899 Other specified pregnancy related conditions, unspecified trimester: Secondary | ICD-10-CM | POA: Insufficient documentation

## 2019-11-26 DIAGNOSIS — O479 False labor, unspecified: Secondary | ICD-10-CM

## 2019-11-26 DIAGNOSIS — O99613 Diseases of the digestive system complicating pregnancy, third trimester: Secondary | ICD-10-CM | POA: Diagnosis not present

## 2019-11-26 DIAGNOSIS — Z3A38 38 weeks gestation of pregnancy: Secondary | ICD-10-CM | POA: Diagnosis not present

## 2019-11-26 DIAGNOSIS — Z3A34 34 weeks gestation of pregnancy: Secondary | ICD-10-CM | POA: Diagnosis not present

## 2019-11-26 LAB — PROTIME-INR
INR: 1 (ref 0.8–1.2)
Prothrombin Time: 13.1 seconds (ref 11.4–15.2)

## 2019-11-26 LAB — COMPREHENSIVE METABOLIC PANEL
ALT: 10 U/L (ref 0–44)
AST: 20 U/L (ref 15–41)
Albumin: 3.4 g/dL — ABNORMAL LOW (ref 3.5–5.0)
Alkaline Phosphatase: 215 U/L — ABNORMAL HIGH (ref 38–126)
Anion gap: 9 (ref 5–15)
BUN: 7 mg/dL (ref 6–20)
CO2: 22 mmol/L (ref 22–32)
Calcium: 8.6 mg/dL — ABNORMAL LOW (ref 8.9–10.3)
Chloride: 107 mmol/L (ref 98–111)
Creatinine, Ser: 0.45 mg/dL (ref 0.44–1.00)
GFR calc Af Amer: >60 mL/min (ref >60)
GFR calc non Af Amer: >60 mL/min (ref >60)
Glucose, Bld: 121 mg/dL — ABNORMAL HIGH (ref 70–99)
Potassium: 3.9 mmol/L (ref 3.5–5.1)
Sodium: 138 mmol/L (ref 135–145)
Total Bilirubin: 0.7 mg/dL (ref 0.3–1.2)
Total Protein: 6.7 g/dL (ref 6.5–8.1)

## 2019-11-26 LAB — CBC WITH DIFFERENTIAL/PLATELET
Abs Immature Granulocytes: 0.04 10*3/uL (ref 0.00–0.07)
Basophils Absolute: 0 10*3/uL (ref 0.0–0.1)
Basophils Relative: 0 %
Eosinophils Absolute: 0.1 10*3/uL (ref 0.0–0.5)
Eosinophils Relative: 2 %
HCT: 30.5 % — ABNORMAL LOW (ref 36.0–46.0)
Hemoglobin: 9.3 g/dL — ABNORMAL LOW (ref 12.0–15.0)
Immature Granulocytes: 1 %
Lymphocytes Relative: 20 %
Lymphs Abs: 1.6 10*3/uL (ref 0.7–4.0)
MCH: 24.6 pg — ABNORMAL LOW (ref 26.0–34.0)
MCHC: 30.5 g/dL (ref 30.0–36.0)
MCV: 80.7 fL (ref 80.0–100.0)
Monocytes Absolute: 0.9 10*3/uL (ref 0.1–1.0)
Monocytes Relative: 11 %
Neutro Abs: 5.3 10*3/uL (ref 1.7–7.7)
Neutrophils Relative %: 66 %
Platelets: 386 10*3/uL (ref 150–400)
RBC: 3.78 MIL/uL — ABNORMAL LOW (ref 3.87–5.11)
RDW: 13.8 % (ref 11.5–15.5)
WBC: 8 10*3/uL (ref 4.0–10.5)
nRBC: 0 % (ref 0.0–0.2)

## 2019-11-26 LAB — URINALYSIS, ROUTINE W REFLEX MICROSCOPIC
Bilirubin Urine: NEGATIVE
Glucose, UA: NEGATIVE mg/dL
Hgb urine dipstick: NEGATIVE
Ketones, ur: 5 mg/dL — AB
Leukocytes,Ua: NEGATIVE
Nitrite: NEGATIVE
Protein, ur: 30 mg/dL — AB
Specific Gravity, Urine: 1.03 (ref 1.005–1.030)
pH: 6 (ref 5.0–8.0)

## 2019-11-26 LAB — SARS CORONAVIRUS 2 BY RT PCR (HOSPITAL ORDER, PERFORMED IN ~~LOC~~ HOSPITAL LAB): SARS Coronavirus 2: NEGATIVE

## 2019-11-26 LAB — LIPASE, BLOOD: Lipase: 24 U/L (ref 11–51)

## 2019-11-26 MED ORDER — SODIUM CHLORIDE 0.9 % IV BOLUS
500.0000 mL | Freq: Once | INTRAVENOUS | Status: AC
  Administered 2019-11-26: 500 mL via INTRAVENOUS

## 2019-11-26 MED ORDER — PANTOPRAZOLE SODIUM 40 MG IV SOLR
40.0000 mg | Freq: Once | INTRAVENOUS | Status: AC
  Administered 2019-11-26: 40 mg via INTRAVENOUS
  Filled 2019-11-26: qty 40

## 2019-11-26 MED ORDER — PANTOPRAZOLE SODIUM 20 MG PO TBEC
20.0000 mg | DELAYED_RELEASE_TABLET | Freq: Every day | ORAL | 0 refills | Status: DC
Start: 2019-11-26 — End: 2019-12-17

## 2019-11-26 NOTE — ED Provider Notes (Signed)
[facility] DEPT Provider Note   CSN: [id number] Arrival date & time: 11/26/19  2127     History Chief Complaint  Patient presents with  . Contractions    Kathleen Morrow is a 24 y.o. female.  G4, P0 approximately [redacted] weeks pregnant complaining of on and off contractions that have been going on for months.  She said currently they are every 10 minutes.  Also complaining of upper abdominal burning pain and has been vomiting for weeks.  She said sometimes has been some blood in it.  She is not on any antacids other than over-the-counter.  She said she thinks she lost her mucous plug but has not had rupture of membranes.  No vaginal bleeding.  Still feeling the baby move.  Follows with family medicine OB service at [facility].  The history is provided by the patient.  Abdominal Pain Pain location:  Generalized Pain quality: cramping   Pain radiates to:  Does not radiate Pain severity:  Moderate Onset quality:  Gradual Timing:  Intermittent Progression:  Worsening Chronicity:  Recurrent Relieved by:  Nothing Worsened by:  Nothing Ineffective treatments:  Antacids Associated symptoms: hematemesis, nausea and vomiting   Associated symptoms: no chest pain, no cough, no diarrhea, no dysuria, no fever, no hematochezia, no hematuria, no shortness of breath, no sore throat and no vaginal bleeding   Risk factors: pregnancy        Past Medical History:  Diagnosis Date  . Asthma     Patient Active Problem List   Diagnosis Date Noted  . Prenatal care in third trimester 10/23/2019  . 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 Vaginal discharge 01/09/2019  . Pregnancy 02/23/2017  . 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 BEHAVIOR PROBLEM 09/25/2008  . ASTHMA, PERSISTENT 06/30/2006    Past Surgical History:  Procedure Laterality Date  . NO PAST SURGERIES       OB History    Gravida  4   Para  0   Term  0   Preterm  0   AB  3     Living  0     SAB      TAB      Ectopic      Multiple      Live Births              Family History  Problem Relation Age of Onset  . Epilepsy Brother   . Epilepsy Mother     Social History   Tobacco Use  . Smoking status: Former Research scientist (life sciences)  . Smokeless tobacco: Never Used  Vaping Use  . Vaping Use: Never used  Substance Use Topics  . Alcohol use: No  . Drug use: No    Home Medications Prior to Admission medications   Medication Sig Start Date End Date Taking? Authorizing Provider  Doxylamine-Pyridoxine 10-10 MG TBEC Take 2 tablets every nights as needed for nausea and vomiting. If no improvement after two days, you may take 1 tablet in the morning and 2 tablets at nighttime PRN. 11/15/19   Kinnie Feil, MD  Ferrous Sulfate (IRON) 325 (65 Fe) MG TABS Take 1 tablet (325 mg total) by mouth daily. Patient not taking: Reported on 10/25/2019 10/24/19   Autry-Lott, Naaman Plummer, DO  Prenatal Vit-Fe Fumarate-FA (PRENATAL VITAMIN) 27-0.8 MG TABS Take 1 tablet by mouth daily. 04/18/19   Doristine Mango L, DO    Allergies    Shellfish allergy, Bee pollen, and Pollen extract  Review of Systems   Review of Systems  Constitutional: Negative for fever.  HENT: Negative for sore throat.   Eyes: Negative for visual disturbance.  Respiratory: Negative for cough and shortness of breath.   Cardiovascular: Negative for chest pain.  Gastrointestinal: Positive for abdominal pain, hematemesis, nausea and vomiting. Negative for diarrhea and hematochezia.  Genitourinary: Negative for dysuria, hematuria and vaginal bleeding.  Musculoskeletal: Negative for neck pain.  Skin: Negative for rash.  Neurological: Negative for headaches.    Physical Exam Updated Vital Signs BP (!) 118/97 (BP Location: Left Arm)   Pulse (!) 113   Temp 98.5 F (36.9 C) (Oral)   Resp 16   Ht 5' (1.524 m)   Wt 65.8 kg   LMP 02/26/2019 (Approximate)   SpO2 99%   BMI 28.32 kg/m   Physical Exam Vitals  and nursing note reviewed.  Constitutional:      General: She is not in acute distress.    Appearance: She is well-developed.  HENT:     Head: Normocephalic and atraumatic.  Eyes:     Conjunctiva/sclera: Conjunctivae normal.  Cardiovascular:     Rate and Rhythm: Regular rhythm. Tachycardia present.     Heart sounds: No murmur heard.   Pulmonary:     Effort: Pulmonary effort is normal. No respiratory distress.     Breath sounds: Normal breath sounds.  Abdominal:     Palpations: Abdomen is soft. There is mass (gravid).     Tenderness: There is no abdominal tenderness.  Genitourinary:    Comments: Bedside internal exam cervix is high head not engaged. Musculoskeletal:        General: No deformity or signs of injury. Normal range of motion.     Cervical back: Neck supple.  Skin:    General: Skin is warm and dry.     Capillary Refill: Capillary refill takes less than 2 seconds.  Neurological:     General: No focal deficit present.     Mental Status: She is alert.     Sensory: No sensory deficit.     Motor: No weakness.     ED Results / Procedures / Treatments   Labs (all labs ordered are listed, but only abnormal results are displayed) Labs Reviewed  COMPREHENSIVE METABOLIC PANEL - Abnormal; Notable for the following components:      Result Value   Glucose, Bld 121 (*)    Calcium 8.6 (*)    Albumin 3.4 (*)    Alkaline Phosphatase 215 (*)    All other components within normal limits  CBC WITH DIFFERENTIAL/PLATELET - Abnormal; Notable for the following components:   RBC 3.78 (*)    Hemoglobin 9.3 (*)    HCT 30.5 (*)    MCH 24.6 (*)    All other components within normal limits  URINALYSIS, ROUTINE W REFLEX MICROSCOPIC - Abnormal; Notable for the following components:   Ketones, ur 5 (*)    Protein, ur 30 (*)    Bacteria, UA RARE (*)    All other components within normal limits  SARS CORONAVIRUS 2 BY RT PCR (HOSPITAL ORDER, [facility])    LIPASE, BLOOD  PROTIME-INR    EKG None  Radiology No results found.  Procedures Procedures (including critical care time)  Medications Ordered in ED Medications  sodium chloride 0.9 % bolus 500 mL (0 mLs Intravenous Stopped 11/26/19 2315)  pantoprazole (PROTONIX) injection 40 mg (40 mg Intravenous Given 11/26/19 2246)    ED Course  I have reviewed the triage vital signs and the nursing notes.  Pertinent labs & imaging results that were available during my care of the patient were reviewed by me and considered in my medical decision making (see chart for details).  Clinical Course as of Nov 27 11  Mon Nov 26, 2019  2210 Rapid response OB is here now she had her exam is 1 cm.  She is hooked up to the toco meter   [MB]  2227 Rapid response is seen the patient and reviewed with Dr. Jimmye Norman over at [facility].  They have cleared the patient from a labor standpoint.  They agree with fluids and PPI.   [MB]  2312 Patient states she is feeling much better.  Asking her to give [place] a urine.  Anticipate discharge soon.   [MB]    Clinical Course User Index [MB] Hayden Rasmussen, MD   MDM Rules/Calculators/A&P                         This patient complains of contractions, epigastric abdominal pain and nausea and vomiting with blood; this involves an extensive number of treatment Options and is a complaint that carries with it a high risk of complications and Morbidity. The differential includes gastritis, Mallory-Weiss, active labor  I ordered, reviewed and interpreted labs, which included CBC with normal white count, hemoglobin low at 9.3 but stable from 1 month ago, chemistries normal other than Mildly elevated glucose elevated alk phos, urinalysis without signs of infection I ordered medication IV fluids and PPI Previous records obtained and reviewed in epic I consulted rapid OB response and discussed lab and imaging findings  Critical Interventions: None  After the interventions  stated above, I reevaluated the patient and found patient to be hemodynamically stable.  Hydrated here with improvement in her pulse rate.  Will put on a PPI.  Return instructions discussed   Final Clinical Impression(s) / ED Diagnoses Final diagnoses:  Uterine contractions during pregnancy  Hematemesis with nausea  Acute gastritis, presence of bleeding unspecified, unspecified gastritis type    Rx / DC Orders ED Discharge Orders    None       Hayden Rasmussen, MD 11/27/19 [phone]

## 2019-11-26 NOTE — Discharge Instructions (Addendum)
You were seen in the emergency department for evaluation of contractions along with upper abdominal pain and heartburn along with vomiting some blood.  You had blood work done that was unremarkable.  The OB team evaluated your pregnancy and felt you are safe for discharge.  We are starting you on some acid medication.  Please follow-up with your OB group.  Return to the emergency department if any worsening or concerning symptoms.  Please understand that we do not do any labor and delivery care at [facility].

## 2019-11-26 NOTE — ED Triage Notes (Addendum)
Pt sts she is [redacted] weeks pregnant and experiencing contractions q5-26mins that started an hour prior to arrival. Pt says she has been vomiting blood for 2 weeks.

## 2019-11-27 NOTE — Progress Notes (Signed)
Pt. Cervical exam 1cm/50%. Pt. Occasional ctx with UI. Category I fetal heart rate tracing. No vaginal bleeding upon exam. Attending Dr. Mayford Knife notified about patient. He recommends fluids and antiemetic. EDP Dr. Charm Barges notified. Pt. Cleared OB at this time. Pt. Educated about labor precautions and location of WCC. Monitors d/c.

## 2019-11-27 NOTE — Progress Notes (Signed)
Pt. Reports to [facility] ED for upper abd. Pain, vomiting blood, and uterine contractions. Pt. [redacted]w[redacted]d and is seen in [place] for her limited prenatal care. Pt. Denies LOF or vaginal bleeding. Pt. Endorse adequate fetal movement. EDP reports vertex per beside ultrasound. Fetal monitors applied.

## 2019-12-03 ENCOUNTER — Other Ambulatory Visit: Payer: Self-pay

## 2019-12-03 DIAGNOSIS — O219 Vomiting of pregnancy, unspecified: Secondary | ICD-10-CM

## 2019-12-03 MED ORDER — DOXYLAMINE-PYRIDOXINE 10-10 MG PO TBEC: DELAYED_RELEASE_TABLET | ORAL | 0 refills | Status: DC

## 2019-12-03 NOTE — Telephone Encounter (Signed)
Patient calls nurse line requesting a refill on nausea medication. Patient reports she can not find the bottle that was called in mid July. Please advise.

## 2019-12-04 ENCOUNTER — Telehealth (HOSPITAL_COMMUNITY): Payer: Self-pay | Admitting: *Deleted

## 2019-12-04 ENCOUNTER — Ambulatory Visit (INDEPENDENT_AMBULATORY_CARE_PROVIDER_SITE_OTHER): Payer: Medicaid Other | Admitting: Family Medicine

## 2019-12-04 ENCOUNTER — Other Ambulatory Visit: Payer: Self-pay

## 2019-12-04 ENCOUNTER — Other Ambulatory Visit (HOSPITAL_COMMUNITY)
Admission: RE | Admit: 2019-12-04 | Discharge: 2019-12-04 | Disposition: A | Discharge status: Home / Self Care | Payer: Medicaid Other | Source: Ambulatory Visit | Attending: Family Medicine | Admitting: Family Medicine

## 2019-12-04 VITALS — BP 104/58 | HR 90 | Wt 146.2 lb

## 2019-12-04 DIAGNOSIS — Z3493 Encounter for supervision of normal pregnancy, unspecified, third trimester: Secondary | ICD-10-CM | POA: Insufficient documentation

## 2019-12-04 DIAGNOSIS — Z3A39 39 weeks gestation of pregnancy: Secondary | ICD-10-CM

## 2019-12-04 NOTE — Progress Notes (Signed)
  [facility] Prenatal Visit  Kathleen Morrow is a 24 y.o. G4P0030 at [redacted]w[redacted]d here for routine follow up. She is dated by early ultrasound.  She reports no complaints. She reports fetal movement. She denies vaginal bleeding, contractions, or loss of fluid. See flow sheet for details.  Vitals:   12/04/19 1108  BP: (!) 104/58  Pulse: 90    A/P: Pregnancy at 100w4d.  Doing well.   1. Routine prenatal care:  Marland Kitchen Dating reviewed, dating tab is correct . Fetal heart tones Appropriate . Fundal height within expected range.  . Fetal position confirmed Vertex using Ultrasound .  Marland Kitchen Infant feeding choice: Formula feeding  . Contraception choice: Undecided  depo vs post placental IUD  . Infant circumcision desired yes in hospital  . Pain control in labor discussed and patient desires Plan natural but is open to epidural.  . Influenza vaccine not administered as not influenza season.   . Tdap previously administered between 27-36 weeks  . GBS and gc/chlamydia testing  were collected today.   . Pregnancy education regarding labor, fetal movement,  benefits of breastfeeding, contraception, and safe infant sleep were discussed.  . Labor and fetal movement precautions reviewed. . Induction of labor discussed. Scheduled for induction at approximately 41 weeks. BPP scheduled between 40-41 weeks.   2. Pregnancy issues include the following and were addressed as appropriate today:  . THC use during pregnancy: Positive UDS, patient will need social work and UDS at time of delivery -Gonorrhea and chlamydia collected today -GBS collected today -Patient has anemia with most recent hemoglobin being 9.3.  She reports she is not been taking her iron supplementation.  CBC collected today, ferritin level collected today -Patient still takes Diclegis as needed for nausea -Patient was scheduled for BPP on Monday at 1015 -Patient was scheduled for induction of labor at 41 weeks 0 days -Labor  precautions discussed . Problem list and pregnancy box updated: Yes.

## 2019-12-04 NOTE — Patient Instructions (Signed)
It was a pleasure to see you today.  We collected your lab work that we needed.  If there is anything abnormal I will call you with those results.  Regarding your pregnancy monitoring we will need an ultrasound which has been scheduled for Monday at 1015.  We have also scheduled your induction of labor at 41 weeks and 0 days and that will be scheduled for 8/13.  They will call you either the day before or the day of to tell you when to come in.  If you go into labor before for now and then that is okay.  Signs of labor include regular contractions which are occurring collection close together, big gush of fluid.  If you have any vaginal bleeding or do not feel baby move that is another reason to be evaluated.   Labor Induction  Labor induction is when steps are taken to cause a pregnant woman to begin the labor process. Most women go into labor on their own between 37 weeks and 42 weeks of pregnancy. When this does not happen or when there is a medical need for labor to begin, steps may be taken to induce labor. Labor induction causes a pregnant woman's uterus to contract. It also causes the cervix to soften (ripen), open (dilate), and thin out (efface). Usually, labor is not induced before 39 weeks of pregnancy unless there is a medical reason to do so. Your health care provider will determine if labor induction is needed. Before inducing labor, your health care provider will consider a number of factors, including:  Your medical condition and your baby's.  How many weeks along you are in your pregnancy.  How mature your baby's lungs are.  The condition of your cervix.  The position of your baby.  The size of your birth canal. What are some reasons for labor induction? Labor may be induced if:  Your health or your baby's health is at risk.  Your pregnancy is overdue by 1 week or more.  Your water breaks but labor does not start on its own.  There is a low amount of amniotic fluid around  your baby. You may also choose (elect) to have labor induced at a certain time. Generally, elective labor induction is done no earlier than 39 weeks of pregnancy. What methods are used for labor induction? Methods used for labor induction include:  Prostaglandin medicine. This medicine starts contractions and causes the cervix to dilate and ripen. It can be taken by mouth (orally) or by being inserted into the vagina (suppository).  Inserting a small, thin tube (catheter) with a balloon into the vagina and then expanding the balloon with water to dilate the cervix.  Stripping the membranes. In this method, your health care provider gently separates amniotic sac tissue from the cervix. This causes the cervix to stretch, which in turn causes the release of a hormone called progesterone. The hormone causes the uterus to contract. This procedure is often done during an office visit, after which you will be sent home to wait for contractions to begin.  Breaking the water. In this method, your health care provider uses a small instrument to make a small hole in the amniotic sac. This eventually causes the amniotic sac to break. Contractions should begin after a few hours.  Medicine to trigger or strengthen contractions. This medicine is given through an IV that is inserted into a vein in your arm. Except for membrane stripping, which can be done in a clinic,  labor induction is done in the hospital so that you and your baby can be carefully monitored. How long does it take for labor to be induced? The length of time it takes to induce labor depends on how ready your body is for labor. Some inductions can take up to 2-3 days, while others may take less than a day. Induction may take longer if:  You are induced early in your pregnancy.  It is your first pregnancy.  Your cervix is not ready. What are some risks associated with labor induction? Some risks associated with labor induction  include:  Changes in fetal heart rate, such as being too high, too low, or irregular (erratic).  Failed induction.  Infection in the mother or the baby.  Increased risk of having a cesarean delivery.  Fetal death.  Breaking off (abruption) of the placenta from the uterus (rare).  Rupture of the uterus (very rare). When induction is needed for medical reasons, the benefits of induction generally outweigh the risks. What are some reasons for not inducing labor? Labor induction should not be done if:  Your baby does not tolerate contractions.  You have had previous surgeries on your uterus, such as a myomectomy, removal of fibroids, or a vertical scar from a previous cesarean delivery.  Your placenta lies very low in your uterus and blocks the opening of the cervix (placenta previa).  Your baby is not in a head-down position.  The umbilical cord drops down into the birth canal in front of the baby.  There are unusual circumstances, such as the baby being very early (premature).  You have had more than 2 previous cesarean deliveries. Summary  Labor induction is when steps are taken to cause a pregnant woman to begin the labor process.  Labor induction causes a pregnant woman's uterus to contract. It also causes the cervix to ripen, dilate, and efface.  Labor is not induced before 39 weeks of pregnancy unless there is a medical reason to do so.  When induction is needed for medical reasons, the benefits of induction generally outweigh the risks. This information is not intended to replace advice given to you by your health care provider. Make sure you discuss any questions you have with your health care provider. Document Revised: 04/22/2017 Document Reviewed: 06/02/2016 Elsevier Patient Education  2020 [employer].

## 2019-12-04 NOTE — Telephone Encounter (Signed)
Preadmission screen  

## 2019-12-05 ENCOUNTER — Telehealth (HOSPITAL_COMMUNITY): Payer: Self-pay | Admitting: *Deleted

## 2019-12-05 LAB — CBC
Hematocrit: 30 % — ABNORMAL LOW (ref 34.0–46.6)
Hemoglobin: 9.3 g/dL — ABNORMAL LOW (ref 11.1–15.9)
MCH: 23.9 pg — ABNORMAL LOW (ref 26.6–33.0)
MCHC: 31 g/dL — ABNORMAL LOW (ref 31.5–35.7)
MCV: 77 fL — ABNORMAL LOW (ref 79–97)
Platelets: 334 10*3/uL (ref 150–450)
RBC: 3.89 x10E6/uL (ref 3.77–5.28)
RDW: 13.2 % (ref 11.7–15.4)
WBC: 6.8 10*3/uL (ref 3.4–10.8)

## 2019-12-05 LAB — CERVICOVAGINAL ANCILLARY ONLY
Chlamydia: NEGATIVE
Comment: NEGATIVE
Comment: NORMAL
Neisseria Gonorrhea: NEGATIVE

## 2019-12-05 LAB — FERRITIN: Ferritin: 10 ng/mL — ABNORMAL LOW (ref 15–150)

## 2019-12-05 NOTE — Telephone Encounter (Signed)
Preadmission screen  

## 2019-12-05 NOTE — Progress Notes (Signed)
I attempted to call this patient and she did not answer. She is still anemic and needs to be taking her iron supplementation. I spoke with her about the iron supplementation during the visit and she said that she had not been taking it. Could someone call her and just let her know that she really needs to take the iron to hopefully bring her hemoglobin up a little before she delivers.   Thank you!

## 2019-12-07 ENCOUNTER — Telehealth (HOSPITAL_COMMUNITY): Payer: Self-pay | Admitting: *Deleted

## 2019-12-07 NOTE — Telephone Encounter (Signed)
Preadmission screen  

## 2019-12-08 LAB — CULTURE, BETA STREP (GROUP B ONLY): Strep Gp B Culture: NEGATIVE

## 2019-12-09 ENCOUNTER — Other Ambulatory Visit: Payer: Self-pay | Admitting: Family Medicine

## 2019-12-09 ENCOUNTER — Other Ambulatory Visit: Payer: Self-pay | Admitting: Advanced Practice Midwife

## 2019-12-10 ENCOUNTER — Telehealth (HOSPITAL_COMMUNITY): Payer: Self-pay | Admitting: *Deleted

## 2019-12-10 ENCOUNTER — Other Ambulatory Visit: Payer: Self-pay

## 2019-12-10 ENCOUNTER — Telehealth: Payer: Self-pay | Admitting: *Deleted

## 2019-12-10 ENCOUNTER — Encounter (HOSPITAL_COMMUNITY): Payer: Self-pay | Admitting: *Deleted

## 2019-12-10 ENCOUNTER — Ambulatory Visit (INDEPENDENT_AMBULATORY_CARE_PROVIDER_SITE_OTHER): Payer: Medicaid Other | Admitting: *Deleted

## 2019-12-10 ENCOUNTER — Other Ambulatory Visit: Payer: Self-pay | Admitting: Family Medicine

## 2019-12-10 ENCOUNTER — Ambulatory Visit: Payer: Self-pay

## 2019-12-10 VITALS — BP 119/68 | HR 87 | Wt 141.2 lb

## 2019-12-10 DIAGNOSIS — O480 Post-term pregnancy: Secondary | ICD-10-CM

## 2019-12-10 NOTE — Telephone Encounter (Signed)
Tried to contact pt to inform her of below and phone only rang, if she calls back please give her the below information.Armond Cuthrell Zimmerman Rumple, CMA

## 2019-12-10 NOTE — Progress Notes (Signed)
IOL scheduled on 8/13

## 2019-12-10 NOTE — Telephone Encounter (Signed)
-----   Message from Derrel Nip, MD sent at 12/05/2019  8:33 AM EDT ----- I attempted to call this patient and she did not answer. She is still anemic and needs to be taking her iron supplementation. I spoke with her about the iron supplementation during the visit and she said that she had not been taking it. Could someone call her and just let her know that she really needs to take the iron to hopefully bring her hemoglobin up a little before she delivers.   Thank you!

## 2019-12-10 NOTE — Telephone Encounter (Signed)
Preadmission screen  

## 2019-12-12 ENCOUNTER — Other Ambulatory Visit (HOSPITAL_COMMUNITY): Admission: RE | Admit: 2019-12-12 | Payer: Medicaid Other | Source: Ambulatory Visit

## 2019-12-14 ENCOUNTER — Encounter (HOSPITAL_COMMUNITY): Payer: Self-pay | Admitting: Family Medicine

## 2019-12-14 ENCOUNTER — Inpatient Hospital Stay (HOSPITAL_COMMUNITY): Payer: Medicaid Other

## 2019-12-14 ENCOUNTER — Other Ambulatory Visit: Payer: Self-pay

## 2019-12-14 ENCOUNTER — Inpatient Hospital Stay (HOSPITAL_COMMUNITY)
Admission: AD | Admit: 2019-12-14 | Discharge: 2019-12-17 | DRG: 806 | Disposition: A | Discharge status: Home / Self Care | Payer: Medicaid Other | Attending: Family Medicine | Admitting: Family Medicine

## 2019-12-14 DIAGNOSIS — Z87891 Personal history of nicotine dependence: Secondary | ICD-10-CM | POA: Diagnosis not present

## 2019-12-14 DIAGNOSIS — O480 Post-term pregnancy: Principal | ICD-10-CM | POA: Diagnosis present

## 2019-12-14 DIAGNOSIS — D649 Anemia, unspecified: Secondary | ICD-10-CM | POA: Diagnosis not present

## 2019-12-14 DIAGNOSIS — F1210 Cannabis abuse, uncomplicated: Secondary | ICD-10-CM | POA: Diagnosis present

## 2019-12-14 DIAGNOSIS — Z3A41 41 weeks gestation of pregnancy: Secondary | ICD-10-CM | POA: Diagnosis not present

## 2019-12-14 DIAGNOSIS — O99324 Drug use complicating childbirth: Secondary | ICD-10-CM | POA: Diagnosis present

## 2019-12-14 DIAGNOSIS — Z20822 Contact with and (suspected) exposure to covid-19: Secondary | ICD-10-CM | POA: Diagnosis present

## 2019-12-14 DIAGNOSIS — O9902 Anemia complicating childbirth: Secondary | ICD-10-CM | POA: Diagnosis not present

## 2019-12-14 HISTORY — DX: Anemia, unspecified: D64.9

## 2019-12-14 LAB — CBC
HCT: 31.1 % — ABNORMAL LOW (ref 36.0–46.0)
Hemoglobin: 9.2 g/dL — ABNORMAL LOW (ref 12.0–15.0)
MCH: 22.7 pg — ABNORMAL LOW (ref 26.0–34.0)
MCHC: 29.6 g/dL — ABNORMAL LOW (ref 30.0–36.0)
MCV: 76.6 fL — ABNORMAL LOW (ref 80.0–100.0)
Platelets: 367 10*3/uL (ref 150–400)
RBC: 4.06 MIL/uL (ref 3.87–5.11)
RDW: 14.6 % (ref 11.5–15.5)
WBC: 7.3 10*3/uL (ref 4.0–10.5)
nRBC: 0 % (ref 0.0–0.2)

## 2019-12-14 LAB — SARS CORONAVIRUS 2 BY RT PCR (HOSPITAL ORDER, PERFORMED IN ~~LOC~~ HOSPITAL LAB): SARS Coronavirus 2: NEGATIVE

## 2019-12-14 LAB — TYPE AND SCREEN
ABO/RH(D): O POS
Antibody Screen: NEGATIVE

## 2019-12-14 MED ORDER — LACTATED RINGERS IV SOLN
500.0000 mL | INTRAVENOUS | Status: DC | PRN
  Administered 2019-12-15 (×2): 500 mL via INTRAVENOUS

## 2019-12-14 MED ORDER — MISOPROSTOL 25 MCG QUARTER TABLET
25.0000 ug | ORAL_TABLET | ORAL | Status: DC | PRN
  Administered 2019-12-14: 25 ug via VAGINAL
  Filled 2019-12-14: qty 1

## 2019-12-14 MED ORDER — OXYCODONE-ACETAMINOPHEN 5-325 MG PO TABS: 1.0000 | ORAL_TABLET | ORAL | Status: DC | PRN

## 2019-12-14 MED ORDER — FENTANYL CITRATE (PF) 100 MCG/2ML IJ SOLN
100.0000 ug | INTRAMUSCULAR | Status: DC | PRN
  Administered 2019-12-14 – 2019-12-15 (×6): 100 ug via INTRAVENOUS
  Filled 2019-12-14 (×6): qty 2

## 2019-12-14 MED ORDER — OXYCODONE-ACETAMINOPHEN 5-325 MG PO TABS: 2.0000 | ORAL_TABLET | ORAL | Status: DC | PRN

## 2019-12-14 MED ORDER — TERBUTALINE SULFATE 1 MG/ML IJ SOLN: 0.2500 mg | Freq: Once | INTRAMUSCULAR | Status: DC | PRN

## 2019-12-14 MED ORDER — ACETAMINOPHEN 325 MG PO TABS
650.0000 mg | ORAL_TABLET | ORAL | Status: DC | PRN
  Administered 2019-12-15: 650 mg via ORAL
  Filled 2019-12-14: qty 2

## 2019-12-14 MED ORDER — LIDOCAINE HCL (PF) 1 % IJ SOLN: 30.0000 mL | INTRAMUSCULAR | Status: DC | PRN

## 2019-12-14 MED ORDER — ONDANSETRON HCL 4 MG/2ML IJ SOLN
4.0000 mg | Freq: Four times a day (QID) | INTRAMUSCULAR | Status: DC | PRN
  Administered 2019-12-15 (×2): 4 mg via INTRAVENOUS
  Filled 2019-12-14 (×2): qty 2

## 2019-12-14 MED ORDER — LACTATED RINGERS IV SOLN: INTRAVENOUS | Status: DC

## 2019-12-14 MED ORDER — SOD CITRATE-CITRIC ACID 500-334 MG/5ML PO SOLN
30.0000 mL | ORAL | Status: DC | PRN
  Administered 2019-12-15: 30 mL via ORAL
  Filled 2019-12-14: qty 30

## 2019-12-14 MED ORDER — OXYTOCIN-SODIUM CHLORIDE 30-0.9 UT/500ML-% IV SOLN
2.5000 [IU]/h | INTRAVENOUS | Status: DC
  Filled 2019-12-14: qty 500

## 2019-12-14 MED ORDER — OXYTOCIN BOLUS FROM INFUSION
333.0000 mL | Freq: Once | INTRAVENOUS | Status: AC
  Administered 2019-12-15: 333 mL via INTRAVENOUS

## 2019-12-14 NOTE — H&P (Addendum)
Marland Kitchen OBSTETRIC ADMISSION HISTORY AND PHYSICAL  Kathleen Morrow is a 24 y.o. female G38P0030 with IUP at [redacted]w[redacted]d by 9 wk 5 day [place] presenting for IOL d/t postdates. She reports +FMs, No LOF, no VB, no blurry vision, headaches or peripheral edema, and RUQ pain.  She plans on bottle feeding. She requests Depo Provera shot before discharge for birth control.  She requires a COVID test, which has been ordered.   We have discussed a plan of cytotec and a foley bulb insertion, with the possible addition of Pitocin, and she is agreeable to this.  Discussed risks and benefits and Jensine has had her questions answered.  Kathleen Morrow denied having any questions or concerns at this time.  She is sitting up in bed talking and laughing.  Has not eaten since lunch, encouraged to eat and rest during this phase.   Kathleen Morrow is in good spirits and eager to meet her son.  FOB, Kathleen Morrow, is at the bedside and supportive.  Her pain relief wishes include an epidural.  She does not wish to breastfeed nor offer colostrum to baby.   She received her prenatal care at [facility]  She had a pos UDS for THC at 15 weeks.  She will require another at delivery.    Dating: By 9 wk 5 day [place] --->  Estimated Date of Delivery: 12/07/19  Sono:  @ 9 w 5 d, EDD: 8.6.21 [place]: [redacted]w[redacted]d, normal anatomy, presentation, 2269g, 5 lbs, 40% EFW   Prenatal History/Complications:  Past Medical History: Past Medical History:  Diagnosis Date  . Anemia   . Asthma     Past Surgical History: Past Surgical History:  Procedure Laterality Date  . NO PAST SURGERIES      Obstetrical History: OB History    Gravida  4   Para  0   Term  0   Preterm  0   AB  3   Living  0     SAB      TAB      Ectopic      Multiple      Live Births              Social History: Social History   Socioeconomic History  . Marital status: Single    Spouse name: Not on file  . Number of children: Not on file  . Years of education: Not on file  . Highest education  level: Not on file  Occupational History  . Occupation: self employed- Tourist information centre manager   Tobacco Use  . Smoking status: Former Games developer  . Smokeless tobacco: Never Used  Vaping Use  . Vaping Use: Never used  Substance and Sexual Activity  . Alcohol use: No  . Drug use: No  . Sexual activity: Yes  Other Topics Concern  . Not on file  Social History Narrative  . Not on file   Social Determinants of Health   Financial Resource Strain:   . Difficulty of Paying Living Expenses:   Food Insecurity: No Food Insecurity  . Worried About Programme researcher, broadcasting/film/video in the Last Year: Never true  . Ran Out of Food in the Last Year: Never true  Transportation Needs: No Transportation Needs  . Lack of Transportation (Medical): No  . Lack of Transportation (Non-Medical): No  Physical Activity:   . Days of Exercise per Week:   . Minutes of Exercise per Session:   Stress:   . Feeling of Stress :   Social  Connections:   . Frequency of Communication with Friends and Family:   . Frequency of Social Gatherings with Friends and Family:   . Attends Religious Services:   . Active Member of Clubs or Organizations:   . Attends Banker Meetings:   Marland Kitchen Marital Status:     Family History: Family History  Problem Relation Age of Onset  . Epilepsy Brother   . Epilepsy Mother     Allergies: Allergies  Allergen Reactions  . Shellfish Allergy Hives and Anaphylaxis    Facial swelling unknonw Facial swelling   . Bee Pollen   . Pollen Extract     Medications Prior to Admission  Medication Sig Dispense Refill Last Dose  . Doxylamine-Pyridoxine 10-10 MG TBEC Take 2 tablets every nights as needed for nausea and vomiting. If no improvement after two days, you may take 1 tablet in the morning and 2 tablets at nighttime PRN. 30 tablet 0 12/14/2019 at Unknown time  . pantoprazole (PROTONIX) 20 MG tablet Take 1 tablet (20 mg total) by mouth daily. 30 tablet 0 12/13/2019 at Unknown time  .  Ferrous Sulfate (IRON) 325 (65 Fe) MG TABS Take 1 tablet (325 mg total) by mouth daily. 30 tablet 3   . Prenatal Vit-Fe Fumarate-FA (PRENATAL VITAMIN) 27-0.8 MG TABS Take 1 tablet by mouth daily. 90 tablet 1       Review of Systems   All systems reviewed and negative except as stated in HPI  Last menstrual period 02/26/2019. General appearance: alert, cooperative and appears stated age Lungs: clear to auscultation bilaterally Heart: regular rate and rhythm Abdomen: soft, non-tender; bowel sounds normal Extremities: Homans sign is negative, no sign of DVT Presentation: cephalic Fetal monitoringBaseline: 135 bpm, Variability: Good {> 6 bpm), Accelerations: Reactive and Decelerations: Absent Uterine activityNone Dilation: 1 Effacement (%): 70 Station: -2 Exam by:: Karl Ito, RN   Prenatal labs: ABO, Rh: --/--/PENDING (08/13 2014) Antibody: PENDING (08/13 2014) Rubella: 8.74 (12/16 1356) RPR: Non Reactive (06/22 1719)  HBsAg: Negative (12/16 1356)  HIV: Non Reactive (06/22 1719)  GBS: Negative/-- (08/03 1215)  1 hr Glucola 133 Genetic screening: normal NT Anatomy US: normal, need growth scan at 32 wks   Prenatal Transfer Tool  Maternal Diabetes: No Genetic Screening: Normal Maternal Ultrasounds/Referrals: Normal Fetal Ultrasounds or other Referrals:  None Maternal Substance Abuse:  Yes:  Type: Marijuana Significant Maternal Medications:  None Significant Maternal Lab Results: Group B Strep negative  Results for orders placed or performed during the hospital encounter of 12/14/19 (from the past 24 hour(s))  CBC   Collection Time: 12/14/19  8:07 PM  Result Value Ref Range   WBC 7.3 4.0 - 10.5 K/uL   RBC 4.06 3.87 - 5.11 MIL/uL   Hemoglobin 9.2 (L) 12.0 - 15.0 g/dL   HCT 14.9 (L) 36 - 46 %   MCV 76.6 (L) 80.0 - 100.0 fL   MCH 22.7 (L) 26.0 - 34.0 pg   MCHC 29.6 (L) 30.0 - 36.0 g/dL   RDW 70.2 63.7 - 85.8 %   Platelets 367 150 - 400 K/uL   nRBC 0.0 0.0 -  0.2 %  Type and screen   Collection Time: 12/14/19  8:14 PM  Result Value Ref Range   ABO/RH(D) PENDING    Antibody Screen PENDING    Sample Expiration      12/17/2019,2359 Performed at [facility], [address] [place], [place] [address]     Patient Active Problem List  Diagnosis Date Noted  . Post term pregnancy at [redacted] weeks gestation 12/14/2019  . Prenatal care in third trimester 10/23/2019  . 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 Vaginal discharge 01/09/2019  . Pregnancy 02/23/2017  . 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 BEHAVIOR PROBLEM 09/25/2008  . ASTHMA, PERSISTENT 06/30/2006    Assessment/Plan:  MARTINA BRODBECK is a 24 y.o. G4P0030 at [redacted]w[redacted]d here for IOL post dates EDD: 8.6.21  #Labor: induction with cytotec and foley bulb #Pain: Desires epidural  #FWB:  EFM, will continue to monitor #ID: covid test ordered and is pending #MOF: bottle   #MOC:DepoProvera before discharge   #Circ:  Yes   Will continue to monitor  Awaiting COVID results   Suggested rest and nutrition during early labor  Pain medication as needed  Encourage keeping bladder empty  May ambulate if she wishes   Bryson Dames, Student-MidWife  12/14/2019, 8:56 PM  Attestation of Supervision of Student:  I confirm that I have verified the information documented in the nurse midwife student's note and that I have also personally reperformed the history, physical exam and all medical decision making activities.  I have verified that all services and findings are accurately documented in this student's note; and I agree with management and plan as outlined in the documentation. I have also made any necessary editorial changes.  FB inserted by CNM, patient tolerated well   Sharyon Cable, CNM [facility] [employer], [facility] 12/14/2019 10:27 PM

## 2019-12-15 ENCOUNTER — Inpatient Hospital Stay (HOSPITAL_COMMUNITY): Payer: Medicaid Other | Admitting: Anesthesiology

## 2019-12-15 ENCOUNTER — Encounter (HOSPITAL_COMMUNITY): Payer: Self-pay | Admitting: Family Medicine

## 2019-12-15 DIAGNOSIS — Z3A41 41 weeks gestation of pregnancy: Secondary | ICD-10-CM

## 2019-12-15 DIAGNOSIS — O480 Post-term pregnancy: Secondary | ICD-10-CM

## 2019-12-15 LAB — RAPID URINE DRUG SCREEN, HOSP PERFORMED
Amphetamines: NOT DETECTED
Barbiturates: NOT DETECTED
Benzodiazepines: NOT DETECTED
Cocaine: NOT DETECTED
Opiates: NOT DETECTED
Tetrahydrocannabinol: POSITIVE — AB

## 2019-12-15 LAB — RPR: RPR Ser Ql: NONREACTIVE

## 2019-12-15 MED ORDER — ONDANSETRON HCL 4 MG/2ML IJ SOLN: 4.0000 mg | INTRAMUSCULAR | Status: DC | PRN

## 2019-12-15 MED ORDER — PHENYLEPHRINE 40 MCG/ML (10ML) SYRINGE FOR IV PUSH (FOR BLOOD PRESSURE SUPPORT)
80.0000 ug | PREFILLED_SYRINGE | INTRAVENOUS | Status: DC | PRN
  Filled 2019-12-15: qty 10

## 2019-12-15 MED ORDER — SENNOSIDES-DOCUSATE SODIUM 8.6-50 MG PO TABS
2.0000 | ORAL_TABLET | ORAL | Status: DC
  Administered 2019-12-16 (×2): 2 via ORAL
  Filled 2019-12-15 (×2): qty 2

## 2019-12-15 MED ORDER — OXYTOCIN-SODIUM CHLORIDE 30-0.9 UT/500ML-% IV SOLN
1.0000 m[IU]/min | INTRAVENOUS | Status: DC
  Administered 2019-12-15: 2 m[IU]/min via INTRAVENOUS

## 2019-12-15 MED ORDER — SODIUM CHLORIDE (PF) 0.9 % IJ SOLN
INTRAMUSCULAR | Status: DC | PRN
  Administered 2019-12-15: 12 mL/h via EPIDURAL

## 2019-12-15 MED ORDER — EPHEDRINE 5 MG/ML INJ: 10.0000 mg | INTRAVENOUS | Status: DC | PRN

## 2019-12-15 MED ORDER — PANTOPRAZOLE SODIUM 40 MG PO TBEC: 40.0000 mg | DELAYED_RELEASE_TABLET | Freq: Every day | ORAL | Status: DC

## 2019-12-15 MED ORDER — ACETAMINOPHEN 325 MG PO TABS
650.0000 mg | ORAL_TABLET | Freq: Four times a day (QID) | ORAL | Status: DC
  Administered 2019-12-15 – 2019-12-17 (×7): 650 mg via ORAL
  Filled 2019-12-15 (×7): qty 2

## 2019-12-15 MED ORDER — DIPHENHYDRAMINE HCL 25 MG PO CAPS: 25.0000 mg | ORAL_CAPSULE | Freq: Four times a day (QID) | ORAL | Status: DC | PRN

## 2019-12-15 MED ORDER — FAMOTIDINE IN NACL 20-0.9 MG/50ML-% IV SOLN
20.0000 mg | Freq: Two times a day (BID) | INTRAVENOUS | Status: DC | PRN
  Administered 2019-12-15: 20 mg via INTRAVENOUS

## 2019-12-15 MED ORDER — DIBUCAINE (PERIANAL) 1 % EX OINT: 1.0000 "application " | TOPICAL_OINTMENT | CUTANEOUS | Status: DC | PRN

## 2019-12-15 MED ORDER — PANTOPRAZOLE SODIUM 40 MG IV SOLR
40.0000 mg | Freq: Two times a day (BID) | INTRAVENOUS | Status: DC
  Administered 2019-12-15: 40 mg via INTRAVENOUS
  Filled 2019-12-15 (×2): qty 40

## 2019-12-15 MED ORDER — PRENATAL MULTIVITAMIN CH
1.0000 | ORAL_TABLET | Freq: Every day | ORAL | Status: DC
  Administered 2019-12-16: 1 via ORAL

## 2019-12-15 MED ORDER — ONDANSETRON HCL 4 MG PO TABS: 4.0000 mg | ORAL_TABLET | ORAL | Status: DC | PRN

## 2019-12-15 MED ORDER — FENTANYL-BUPIVACAINE-NACL 0.5-0.125-0.9 MG/250ML-% EP SOLN
12.0000 mL/h | EPIDURAL | Status: DC | PRN
  Filled 2019-12-15: qty 250

## 2019-12-15 MED ORDER — WITCH HAZEL-GLYCERIN EX PADS: 1.0000 "application " | MEDICATED_PAD | CUTANEOUS | Status: DC | PRN

## 2019-12-15 MED ORDER — LIDOCAINE-EPINEPHRINE (PF) 2 %-1:200000 IJ SOLN
INTRAMUSCULAR | Status: DC | PRN
  Administered 2019-12-15: 5 mL via EPIDURAL

## 2019-12-15 MED ORDER — TETANUS-DIPHTH-ACELL PERTUSSIS 5-2.5-18.5 LF-MCG/0.5 IM SUSP: 0.5000 mL | Freq: Once | INTRAMUSCULAR | Status: DC

## 2019-12-15 MED ORDER — COCONUT OIL OIL: 1.0000 "application " | TOPICAL_OIL | Status: DC | PRN

## 2019-12-15 MED ORDER — PHENYLEPHRINE 40 MCG/ML (10ML) SYRINGE FOR IV PUSH (FOR BLOOD PRESSURE SUPPORT): 80.0000 ug | PREFILLED_SYRINGE | INTRAVENOUS | Status: DC | PRN

## 2019-12-15 MED ORDER — DIPHENHYDRAMINE HCL 50 MG/ML IJ SOLN: 12.5000 mg | INTRAMUSCULAR | Status: DC | PRN

## 2019-12-15 MED ORDER — IBUPROFEN 600 MG PO TABS
600.0000 mg | ORAL_TABLET | Freq: Three times a day (TID) | ORAL | Status: DC | PRN
  Administered 2019-12-16 (×3): 600 mg via ORAL
  Filled 2019-12-15 (×3): qty 1

## 2019-12-15 MED ORDER — LIDOCAINE HCL (PF) 1 % IJ SOLN
INTRAMUSCULAR | Status: DC | PRN
  Administered 2019-12-15 (×2): 4 mL via EPIDURAL

## 2019-12-15 MED ORDER — SIMETHICONE 80 MG PO CHEW: 80.0000 mg | CHEWABLE_TABLET | ORAL | Status: DC | PRN

## 2019-12-15 MED ORDER — FAMOTIDINE IN NACL 20-0.9 MG/50ML-% IV SOLN
INTRAVENOUS | Status: AC
  Filled 2019-12-15: qty 50

## 2019-12-15 MED ORDER — BENZOCAINE-MENTHOL 20-0.5 % EX AERO
1.0000 "application " | INHALATION_SPRAY | CUTANEOUS | Status: DC | PRN
  Administered 2019-12-15: 1 via TOPICAL
  Filled 2019-12-15: qty 56

## 2019-12-15 MED ORDER — LACTATED RINGERS IV SOLN
500.0000 mL | Freq: Once | INTRAVENOUS | Status: AC
  Administered 2019-12-15: 500 mL via INTRAVENOUS

## 2019-12-15 NOTE — Progress Notes (Signed)
LABOR PROGRESS NOTE  Kathleen Morrow is a 24 y.o. G4P0030 at [redacted]w[redacted]d  admitted for IOL for PD  Subjective: Patient doing well, comfortable now that foley bulb is out   Objective: BP (!) 95/52   Pulse 89   Temp 97.9 F (36.6 C) (Oral)   Resp 17   Ht 5' (1.524 m)   Wt 68 kg Comment: pt stated  LMP 02/26/2019 (Approximate)   SpO2 99%   BMI 29.29 kg/m  or  Vitals:   12/14/19 2021 12/14/19 2150 12/15/19 0005 12/15/19 0115  BP: 104/73 (!) 108/58 (!) 105/48 (!) 95/52  Pulse: 98 88 81 89  Resp: 17 18 18 17   Temp: 97.9 F (36.6 C) 97.8 F (36.6 C) 97.9 F (36.6 C)   TempSrc: Oral Oral Oral   SpO2: 100% 100% 99%   Weight: 68 kg     Height: 5' (1.524 m)       Dilation: 4.5 Effacement (%): 70 Cervical Position: Middle Station: -2 Presentation: Vertex Exam by:: C Cornetto RN FHT: baseline rate 135, moderate varibility, +accel, no decel Toco: 2-7, mild by palpation   Labs: Lab Results  Component Value Date   WBC 7.3 12/14/2019   HGB 9.2 (L) 12/14/2019   HCT 31.1 (L) 12/14/2019   MCV 76.6 (L) 12/14/2019   PLT 367 12/14/2019    Patient Active Problem List   Diagnosis Date Noted  . Post term pregnancy at [redacted] weeks gestation 12/14/2019  . Prenatal care in third trimester 10/23/2019  . 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 Vaginal discharge 01/09/2019  . Pregnancy 02/23/2017  . 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 BEHAVIOR PROBLEM 09/25/2008  . 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 G4P0030 at [redacted]w[redacted]d here for IOL for PD  Labor: Foley bulb out, pitocin started at 43milli-unit/min, continue to titrate to active labor  Fetal Wellbeing:  Cat I  Pain Control:  Plans epidural  Anticipated MOD:  SVD  3m, CNM 12/15/2019, 2:04 AM

## 2019-12-15 NOTE — Anesthesia Preprocedure Evaluation (Signed)
Anesthesia Evaluation  Patient identified by MRN, date of birth, ID band Patient awake    Reviewed: Allergy & Precautions, NPO status , Patient's Chart, lab work & pertinent test results  History of Anesthesia Complications Negative for: history of anesthetic complications  Airway Mallampati: II   Neck ROM: Full    Dental   Pulmonary asthma , former smoker,    Pulmonary exam normal        Cardiovascular negative cardio ROS Normal cardiovascular exam     Neuro/Psych  Headaches, negative psych ROS   GI/Hepatic negative GI ROS, Neg liver ROS,   Endo/Other  negative endocrine ROS  Renal/GU negative Renal ROS     Musculoskeletal negative musculoskeletal ROS (+)   Abdominal   Peds  Hematology  (+) anemia ,  Plt 367k    Anesthesia Other Findings Covid test negative   Reproductive/Obstetrics (+) Pregnancy                             Anesthesia Physical Anesthesia Plan  ASA: II  Anesthesia Plan: Epidural   Post-op Pain Management:    Induction:   PONV Risk Score and Plan: 2 and Treatment may vary due to age or medical condition  Airway Management Planned: Natural Airway  Additional Equipment: None  Intra-op Plan:   Post-operative Plan:   Informed Consent: I have reviewed the patients History and Physical, chart, labs and discussed the procedure including the risks, benefits and alternatives for the proposed anesthesia with the patient or authorized representative who has indicated his/her understanding and acceptance.       Plan Discussed with: Anesthesiologist  Anesthesia Plan Comments: (Labs reviewed. Platelets acceptable, patient not taking any blood thinning medications. Per RN, FHR tracing reported to be stable enough for sitting procedure. Risks and benefits discussed with patient, including PDPH, backache, epidural hematoma, failed epidural, blood pressure changes,  allergic reaction, and nerve injury. Patient expressed understanding and wished to proceed.)        Anesthesia Quick Evaluation

## 2019-12-15 NOTE — Discharge Summary (Signed)
Postpartum Discharge Summary     Patient Name: Kathleen Morrow DOB: [birth year] MRN: [id number]  Date of admission: 12/14/2019 Delivery date:12/15/2019  Delivering provider: Randa Ngo  Date of discharge: 12/17/2019  Admitting diagnosis: Post term pregnancy at [redacted] weeks gestation [O48.0, Z3A.41] Intrauterine pregnancy: [redacted]w[redacted]d    Secondary diagnosis:  Active Problems:   Post term pregnancy at 439weeks gestation   Encounter for vaginal delivery  Additional problems: none   Discharge diagnosis: Term Pregnancy Delivered                                              Post partum procedures:none Augmentation: AROM, Pitocin, Cytotec and IP Foley Complications: None  Hospital course: Induction of Labor With Vaginal Delivery   [name] at 445w1das admitted to the hospital 12/14/2019 for induction of labor.  Indication for induction: Postdates.  Patient had an uncomplicated labor course as follows: Membrane Rupture Time/Date: 10:56 AM ,12/15/2019   Delivery Method:Vaginal, Spontaneous  Episiotomy: None  Lacerations:  Periurethral  Details of delivery can be found in separate delivery note. Patient had a routine postpartum course. Patient is discharged home 12/17/19.  Newborn Data: Birth date:12/15/2019  Birth time:6:00 PM  Gender:Female  Living status:Living  Apgars:9 ,9  Weight:3569 g   Magnesium Sulfate received: No BMZ received: No Rhophylac:N/A MMR:N/A T-DaP:Given prenatally Flu: No Transfusion:No  Physical exam  Vitals:   12/16/19 1640 12/16/19 1952 12/16/19 2226 12/17/19 0547  BP: 116/73 134/64 (!) 105/51 95/60  Pulse: 85 75 65 67  Resp: '18 16  15  '$ Temp: 97.9 F (36.6 C) 98.2 F (36.8 C) 98.4 F (36.9 C) 98.2 F (36.8 C)  TempSrc: Axillary Oral Oral Oral  SpO2:  100% 100%   Weight:      Height:       General: alert, cooperative and no distress Lochia: appropriate Uterine Fundus: firm Incision: N/A DVT Evaluation: No evidence of DVT seen on  physical exam. No cords or calf tenderness. No significant calf/ankle edema. Labs: Lab Results  Component Value Date   WBC 16.8 (H) 12/16/2019   HGB 7.4 (L) 12/16/2019   HCT 23.7 (L) 12/16/2019   MCV 75.7 (L) 12/16/2019   PLT 282 12/16/2019   CMP Latest Ref Rng & Units 11/26/2019  Glucose 70 - 99 mg/dL 121(H)  BUN 6 - 20 mg/dL 7  Creatinine 0.44 - 1.00 mg/dL 0.45  Sodium 135 - 145 mmol/L 138  Potassium 3.5 - 5.1 mmol/L 3.9  Chloride 98 - 111 mmol/L 107  CO2 22 - 32 mmol/L 22  Calcium 8.9 - 10.3 mg/dL 8.6(L)  Total Protein 6.5 - 8.1 g/dL 6.7  Total Bilirubin 0.3 - 1.2 mg/dL 0.7  Alkaline Phos 38 - 126 U/L 215(H)  AST 15 - 41 U/L 20  ALT 0 - 44 U/L 10   Edinburgh Score: Edinburgh Postnatal Depression Scale Screening Tool 12/17/2019  I have been able to laugh and see the funny side of things. 0  I have looked forward with enjoyment to things. 0  I have blamed myself unnecessarily when things went wrong. 0  I have been anxious or worried for no good reason. 0  I have felt scared or panicky for no good reason. 0  Things have been getting on top of me. 0  I have been so unhappy that I have had difficulty  sleeping. 0  I have felt sad or miserable. 0  I have been so unhappy that I have been crying. 0  The thought of harming myself has occurred to me. 0  Edinburgh Postnatal Depression Scale Total 0     After visit meds:  Allergies as of 12/17/2019      Reactions   Shellfish Allergy Hives, Anaphylaxis   Facial swelling unknonw Facial swelling   Bee Pollen    Pollen Extract       Medication List    STOP taking these medications   Doxylamine-Pyridoxine 10-10 MG Tbec   pantoprazole 20 MG tablet Commonly known as: PROTONIX     TAKE these medications   acetaminophen 325 MG tablet Commonly known as: Tylenol Take 2 tablets (650 mg total) by mouth every 6 (six) hours.   coconut oil Oil Apply 1 application topically as needed (for nipple pain).   ibuprofen 600 MG  tablet Commonly known as: ADVIL Take 1 tablet (600 mg total) by mouth every 8 (eight) hours as needed for moderate pain.   Iron 325 (65 Fe) MG Tabs Take 1 tablet (325 mg total) by mouth daily.   Prenatal Vitamin 27-0.8 MG Tabs Take 1 tablet by mouth daily.      Discharge home in stable condition Infant Feeding: formula Infant Disposition:home with mother Discharge instruction: per After Visit Summary and Postpartum booklet. Activity: Advance as tolerated. Pelvic rest for 6 weeks.  Diet: routine diet Future Appointments: Future Appointments  Date Time Provider [place]  01/25/2020 10:10 AM Kinnie Feil, MD [facility] [place]   Follow up Visit:  Please schedule this patient for a In person postpartum visit in 4 weeks with the following provider: Any provider. Additional Postpartum F/U:none  Low risk pregnancy complicated by: none Delivery mode:  Vaginal, Spontaneous  Anticipated Birth Control:  Depo  Randa Ngo, MD OB Fellow, [facility] 12/17/2019 9:01 AM

## 2019-12-15 NOTE — Progress Notes (Signed)
Labor Progress Note Kathleen Morrow is a 24 y.o. G4P0030 at [redacted]w[redacted]d presented for IOL for PD.  S: feeling more comfortable in bed now after epidural.  She notes that her heartburn is getting very uncomfortable.  No new complaints. No HA or vision changes.  O:  BP 135/79   Pulse 70   Temp 97.9 F (36.6 C) (Axillary)   Resp 18   Ht 5' (1.524 m)   Wt 68 kg Comment: pt stated  LMP 02/26/2019 (Approximate)   SpO2 99%   BMI 29.29 kg/m  EFM: 125/moderate var/accels, no decels.  CVE: Dilation: 6 Effacement (%): 70 Cervical Position: Middle Station: -1 Presentation: Vertex Exam by:: C Cornetto RN   A&P: 24 y.o. B1D1761 [redacted]w[redacted]d here for IOL for PD.  #Labor: Progressing well. Pit halved due to tachysystole.  Plan to AROM once pt's mother is here in several minutes. #Pain: epidural in place #FWB: Cat I #GBS negative   Mirian Mo, MD 10:30 AM

## 2019-12-15 NOTE — Anesthesia Procedure Notes (Signed)
Epidural Patient location during procedure: OB Start time: 12/15/2019 7:43 AM End time: 12/15/2019 7:46 AM  Staffing Anesthesiologist: Beryle Lathe, MD Performed: anesthesiologist   Preanesthetic Checklist Completed: patient identified, IV checked, risks and benefits discussed, monitors and equipment checked, pre-op evaluation and timeout performed  Epidural Patient position: sitting Prep: DuraPrep Patient monitoring: continuous pulse ox and blood pressure Approach: midline Location: L3-L4 Injection technique: LOR saline  Needle:  Needle type: Tuohy  Needle gauge: 17 G Needle length: 9 cm Needle insertion depth: 4 cm Catheter size: 19 Gauge Catheter at skin depth: 9 cm Test dose: negative and Other (1% lidocaine)  Assessment Events: blood not aspirated  Additional Notes Patient identified. Risks including, but not limited to, bleeding, infection, nerve damage, paralysis, inadequate analgesia, blood pressure changes, nausea, vomiting, allergic reaction, postpartum back pain, itching, and headache were discussed. Patient expressed understanding and wished to proceed. Sterile prep and drape, including hand hygiene, mask, and sterile gloves were used. The patient was positioned and the spine was prepped. The skin was anesthetized with lidocaine. No paraesthesia or other complication noted. The patient did not experience any signs of intravascular injection such as tinnitus or metallic taste in mouth, nor signs of intrathecal spread such as rapid motor block. Please see nursing notes for vital signs. The patient tolerated the procedure well.   Leslye Peer, MDReason for block:procedure for pain

## 2019-12-15 NOTE — Progress Notes (Signed)
Labor Progress Note Kathleen Morrow is a 24 y.o. G4P0030 at [redacted]w[redacted]d presented for IOL for PD.  S: Moderate sharp left lower abdominal pain following AROM. Continues to have significant heartburn.  O:  BP 118/70   Pulse 88   Temp (!) 97.5 F (36.4 C) (Axillary)   Resp 18   Ht 5' (1.524 m)   Wt 68 kg Comment: pt stated  LMP 02/26/2019 (Approximate)   SpO2 99%   BMI 29.29 kg/m  EFM: 135/moderate var/po accels, no decels  CVE: Dilation: 5 Effacement (%): 80 Cervical Position: Middle Station: -1 Presentation: Vertex Exam by:: Dr Homero Fellers   A&P: 24 y.o. B7S2831 [redacted]w[redacted]d here for IOL for PD.  #Labor: Progressing well. Will assess again in 4 hours continue pit.  Low suspicion for abruption (regarding sharp pain following AROM).  Fetal tracing is reassuring and abdomen is not rigid.  Will continue to monitor and DC pit if symptoms worsen. #Pain: Epidural in place #FWB: Cat I #GBS negative #hemoptysis: No abdominal pain.  Possibly related to esophageal erosions or gastric etiology (though no abdominal pain).  BID protonix for now.  Will likely improve with delivery.  Consider h.pylori testing in the future.  Mirian Mo, MD 11:37 AM

## 2019-12-15 NOTE — Discharge Instructions (Signed)

## 2019-12-15 NOTE — Progress Notes (Signed)
LABOR PROGRESS NOTE  Kathleen Morrow is a 24 y.o. G4P0030 at [redacted]w[redacted]d  admitted for IOL for PD  Subjective: Patient breathing through contractions, rating pain 10/10- discussed with patient plan for pain management. Patient requesting epidural at this time.   Objective: BP 113/60   Pulse 88   Temp 97.8 F (36.6 C) (Axillary)   Resp 18   Ht 5' (1.524 m)   Wt 68 kg Comment: pt stated  LMP 02/26/2019 (Approximate)   SpO2 99%   BMI 29.29 kg/m  or  Vitals:   12/15/19 0343 12/15/19 0449 12/15/19 0451 12/15/19 0607  BP: (!) 98/50 (!) 101/49  113/60  Pulse: 71 80  88  Resp: 17  16 18   Temp:   97.8 F (36.6 C)   TempSrc:   Axillary   SpO2:      Weight:      Height:        Dilation: 5.5 Effacement (%): 70 Cervical Position: Middle Station: -1 Presentation: Vertex Exam by:: C Cornetto RN FHT: baseline rate 135, moderate varibility, +accel, one variable decel Toco: 2-3, moderate by palpation   Labs: Lab Results  Component Value Date   WBC 7.3 12/14/2019   HGB 9.2 (L) 12/14/2019   HCT 31.1 (L) 12/14/2019   MCV 76.6 (L) 12/14/2019   PLT 367 12/14/2019    Patient Active Problem List   Diagnosis Date Noted  . Post term pregnancy at [redacted] weeks gestation 12/14/2019  . Prenatal care in third trimester 10/23/2019  . 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 Vaginal discharge 01/09/2019  . Pregnancy 02/23/2017  . 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 BEHAVIOR PROBLEM 09/25/2008  . 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 G4P0030 at [redacted]w[redacted]d here for IOL for PD  Labor: Progressing well on pitocin, plan to AROM after patient is comfortable with epidural  Fetal Wellbeing:  Cat I  Pain Control:  Requesting epidural at this time  Anticipated MOD:  SVD  [redacted]w[redacted]d, CNM 12/15/2019, 7:02 AM

## 2019-12-16 LAB — CBC
HCT: 23.7 % — ABNORMAL LOW (ref 36.0–46.0)
Hemoglobin: 7.4 g/dL — ABNORMAL LOW (ref 12.0–15.0)
MCH: 23.6 pg — ABNORMAL LOW (ref 26.0–34.0)
MCHC: 31.2 g/dL (ref 30.0–36.0)
MCV: 75.7 fL — ABNORMAL LOW (ref 80.0–100.0)
Platelets: 282 10*3/uL (ref 150–400)
RBC: 3.13 MIL/uL — ABNORMAL LOW (ref 3.87–5.11)
RDW: 14.6 % (ref 11.5–15.5)
WBC: 16.8 10*3/uL — ABNORMAL HIGH (ref 4.0–10.5)
nRBC: 0 % (ref 0.0–0.2)

## 2019-12-16 MED ORDER — FERROUS SULFATE 325 (65 FE) MG PO TABS
325.0000 mg | ORAL_TABLET | Freq: Two times a day (BID) | ORAL | Status: DC
  Administered 2019-12-16 – 2019-12-17 (×3): 325 mg via ORAL
  Filled 2019-12-16 (×3): qty 1

## 2019-12-16 NOTE — Anesthesia Postprocedure Evaluation (Signed)
Anesthesia Post Note  Patient: Selma A Georgi  Procedure(s) Performed: AN AD HOC LABOR EPIDURAL     Patient location during evaluation: Mother Baby Anesthesia Type: Epidural Level of consciousness: awake and alert Pain management: pain level controlled Vital Signs Assessment: post-procedure vital signs reviewed and stable Respiratory status: spontaneous breathing, nonlabored ventilation and respiratory function stable Cardiovascular status: stable Postop Assessment: no headache, no backache, epidural receding, no apparent nausea or vomiting, patient able to bend at knees, adequate PO intake and able to ambulate Anesthetic complications: no   No complications documented.  Last Vitals:  Vitals:   12/15/19 2115 12/16/19 0430  BP: 120/61 (!) 99/56  Pulse: 91 (!) 56  Resp: 16 18  Temp: 36.8 C 36.7 C  SpO2: 100% 100%    Last Pain:  Vitals:   12/16/19 0726  TempSrc:   PainSc: 6    Pain Goal: Patients Stated Pain Goal: 3 (12/15/19 0740)                 Laban Emperor

## 2019-12-16 NOTE — Progress Notes (Signed)
POSTPARTUM PROGRESS NOTE  Post Partum Day 1  Subjective:  Kathleen Morrow is a 24 y.o. [phone] s/p SVD at [redacted]w[redacted]d.  No acute events overnight.  Pt denies problems with ambulating, voiding or po intake.  She denies nausea or vomiting.  Pain is well controlled.  She has had flatus. She has not had bowel movement.  Lochia Moderate.   Objective: Blood pressure (!) 99/56, pulse (!) 56, temperature 98.1 F (36.7 C), temperature source Oral, resp. rate 18, height 5' (1.524 m), weight 68 kg, last menstrual period 02/26/2019, SpO2 100 %, unknown if currently breastfeeding.  Physical Exam:  General: alert, cooperative and no distress Chest: no respiratory distress Heart:regular rate, distal pulses intact Abdomen: soft, nontender,  Uterine Fundus: firm, appropriately tender DVT Evaluation: No calf swelling or tenderness Extremities: no edema Skin: warm, dry  Recent Labs    12/14/19 2007  HGB 9.2*  HCT 31.1*    Assessment/Plan: Kathleen Morrow is a 24 y.o. [phone] s/p SVD at [redacted]w[redacted]d   PPD#1 - Doing well Contraception: Depo at discharge Feeding: Bottle, patient reports breastfeeding x1 d/t baby not wanting to take bottle but plans to formula feed or pump  Dispo: Plan for discharge tomorrow. Anemia- PO Fe initiated this morning    LOS: 2 days   Sharyon Cable, CNM 12/16/2019, 7:35 AM

## 2019-12-16 NOTE — Progress Notes (Signed)
Discussed with mom at bedside about circumcision.   Circumcision is a surgery that removes the skin that covers the tip of the penis, called the "foreskin." Circumcision is usually done when a boy is between 1 and 10 days old, sometimes up to 3-4 weeks old.  The most common reasons boys are circumcised include for cultural/religious beliefs or for parental preference (potentially easier to clean, so baby looks like daddy, etc).  There may be some medical benefits for circumcision:   Circumcised boys seem to have slightly lower rates of: ? Urinary tract infections (per the American Academy of Pediatrics an uncircumcised boy has a 1/100 chance of developing a UTI in the first year of life, a circumcised boy at a 05/998 chance of developing a UTI in the first year of life- a 10% reduction) ? Penis cancer (typically rare- an uncircumcised female has a 1 in 100,000 chance of developing cancer of the penis) ? Sexually transmitted infection (in endemic areas, including HIV, HPV and Herpes- circumcision does NOT protect against gonorrhea, chlamydia, trachomatis, or syphilis) ? Phimosis: a condition where that makes retraction of the foreskin over the glans impossible (0.4 per 1000 boys per year or 0.6% of boys are affected by their 15th birthday)  Boys and men who are not circumcised can reduce these extra risks by: ? Cleaning their penis well ? Using condoms during sex  What are the risks of circumcision?  As with any surgical procedure, there are risks and complications. In circumcision, complications are rare and usually minor, the most common being: ? Bleeding- risk is reduced by holding each clamp for 30 seconds prior to a cut being made, and by holding pressure after the procedure is done ? Infection- the penis is cleaned prior to the procedure, and the procedure is done under sterile technique ? Damage to the urethra or amputation of the penis  How is circumcision done in baby boys?  The baby  will be placed on a special table and the legs restrained for their safety. Numbing medication is injected into the penis, and the skin is cleansed with betadine to decrease the risk of infection.   What to expect:  The penis will look red and raw for 5-7 days as it heals. We expect scabbing around where the cut was made, as well as clear-pink fluid and some swelling of the penis right after the procedure. If your baby's circumcision starts to bleed or develops pus, please contact your pediatrician immediately.  All questions were answered and mother consented for the procedure.  

## 2019-12-17 ENCOUNTER — Encounter (HOSPITAL_COMMUNITY): Payer: Self-pay | Admitting: Family Medicine

## 2019-12-17 MED ORDER — ACETAMINOPHEN 325 MG PO TABS: 650.0000 mg | ORAL_TABLET | Freq: Four times a day (QID) | ORAL | Status: DC

## 2019-12-17 MED ORDER — COCONUT OIL OIL: 1.0000 "application " | TOPICAL_OIL | 0 refills | Status: DC | PRN

## 2019-12-17 MED ORDER — IBUPROFEN 600 MG PO TABS: 600.0000 mg | ORAL_TABLET | Freq: Three times a day (TID) | ORAL | 0 refills | Status: DC | PRN

## 2019-12-17 MED ORDER — MEDROXYPROGESTERONE ACETATE 150 MG/ML IM SUSP
150.0000 mg | Freq: Once | INTRAMUSCULAR | Status: AC
  Administered 2019-12-17: 150 mg via INTRAMUSCULAR
  Filled 2019-12-17: qty 1

## 2019-12-17 NOTE — Clinical Social Work Maternal (Signed)
CLINICAL SOCIAL WORK MATERNAL/CHILD NOTE  Patient Details  Name: Kathleen Morrow MRN: [id number] Date of Birth: 10-15-1995  Date:  12/17/2019  Clinical Social Worker Initiating Note:  Elijio Miles Date/Time: Initiated:  12/17/19/1144     Child's Name:  Trellis Moment   Biological Parents:  Mother, Father Ivor Costa DOB: 05/26/1998)   Need for Interpreter:  None   Reason for Referral:  Current Substance Use/Substance Use During Pregnancy     Address:  [address] [place] [address]    Phone number:  [phone] (home)     Additional phone number:   Household Members/Support Persons (HM/SP):   Household Member/Support Person 1   HM/SP Name Relationship DOB or Age  HM/SP -1   FOB    HM/SP -2        HM/SP -3        HM/SP -4        HM/SP -5        HM/SP -6        HM/SP -7        HM/SP -8          Natural Supports (not living in the home):  Extended Family, Immediate Family   Professional Supports: None   Employment: Unemployed   Type of Work:     Education:  [place] arranged:    Museum/gallery curator Resources:  Medicaid   Other Resources:  Arts development officer Considerations Which May Impact Care:    Strengths:  Ability to meet basic needs  , Home prepared for child     Psychotropic Medications:         Pediatrician:       Pediatrician List:   [place]      Pediatrician Fax Number:    Risk Factors/Current Problems:  Substance Use     Cognitive State:  Able to Concentrate  , Alert  , Linear Thinking     Mood/Affect:  Bright  , Calm  , Comfortable  , Interested     CSW Assessment:  CSW received consult for THC use during pregnancy.  CSW met with MOB to offer support and complete assessment.    MOB using the bathroom with FOB asleep in bed and infant out of the room getting circumcised, when CSW  entered the room. CSW offered to come back at a later time but MOB welcoming of [place] visit. CSW introduced self and received verbal permission to complete assessment while FOB was asleep. FOB did not awake during assessment. MOB very pleasant and engaged throughout visit. CSW inquired about MOB's mental health history to which MOB denied having any. CSW provided education regarding the baby blues period vs. perinatal mood disorders, discussed treatment and gave resources for mental health follow up if concerns arise.  CSW recommends self-evaluation during the postpartum time period using the New Mom Checklist from Postpartum Progress and encouraged MOB to contact a medical professional if symptoms are noted at any time. MOB denied any current SI or HI and reported having a good support system.  CSW inquired about MOB's substance use during pregnancy to which MOB acknowledged use of marijuana to help with her nausea and appetite. Per MOB, last use was when she was 7 months pregnant. MOB denied use of any other illicit drugs. CSW informed MOB  of [place] and explained UDS came back positive for [facility] and that [facility] CPS report would be made. CSW detailed what process would likely look like to which MOB expressed understanding and denied any questions or concerns regarding report.   MOB confirmed having all essential items for infant once discharged and stated infant would be sleeping in a bassinet once home. CSW provided review of Sudden Infant Death Syndrome (SIDS) precautions and safe sleeping habits.    CSW made [facility] CPS report due to infant's positive UDS results for THC. No barriers to discharge, CPS to follow up within 72 hours.   CSW Plan/Description:  No Further Intervention Required/No Barriers to Discharge, Perinatal Mood and Anxiety Disorder (PMADs) Education, Sudden Infant Death Syndrome (SIDS) Education, [place], Child Protective Service  Report  , CSW Will Continue to Monitor Umbilical Cord Tissue Drug Screen Results and Make Report if Foye Spurling, LCSW 12/17/2019, 12:41 PM

## 2019-12-17 NOTE — Lactation Note (Signed)
This note was copied from a baby's chart. Lactation Consultation Note  Patient Name: Kathleen Morrow Date: 12/17/2019   Mother has chosen to formula feed her infant. Reviewed applying cabbage leaves to breast to reduce supply.     Maternal Data    Feeding Feeding Type: Bottle Fed - Formula  LATCH Score                   Interventions Interventions:  (reviewed cabbage leaves)  Lactation Tools Discussed/Used     Consult Status Consult Status: Complete    Hardie Pulley 12/17/2019, 9:50 AM

## 2019-12-18 ENCOUNTER — Telehealth: Payer: Self-pay | Admitting: *Deleted

## 2019-12-18 NOTE — Telephone Encounter (Signed)
Contacted pt to complete transition of care assessment:  Transition Care Management Follow-up Telephone Call   [facility] Managed Care Transition Call Status:MM [facility] Call Made   Date of discharge and from where: [facility], 12/17/19   How have you been since you were released from the hospital? "ok"   Any questions or concerns? Vaginal discomfort  Items Reviewed:  Did the pt receive and understand the discharge instructions provided? Yes   Medications obtained and verified? Yes   Any new allergies since your discharge? No   Dietary orders reviewed? Yes  Do you have support at home? Yes, family  Functional Questionnaire: (I = Independent and D = Dependent)  ADLs: Independent Bathing/Dressing:Independent Meal Prep: Independent Eating: Independent Maintaining continence: Independent Transferring/Ambulation: Independent Managing Meds: Independent   Follow up appointments reviewed:   PCP Hospital f/u appt confirmed? Yes , Patient states she is  currently at appt at [facility] f/u appt confirmed?  Yes, patient   scheduled to see Dr Janit Pagan on 01/08/20 @ 1010  Are transportation arrangements needed? No   If their condition worsens, is the pt aware to call PCP or go to the EmergencyDept.?  Yes Was the patient provided with contact information for the PCP's office or ED? yes  Was to pt encouraged to call back with questions or concerns? Yes Burnard Bunting, RN, BSN, CCRN [facility] [phone]

## 2019-12-25 DIAGNOSIS — U071 COVID-19: Secondary | ICD-10-CM | POA: Diagnosis not present

## 2019-12-25 DIAGNOSIS — R509 Fever, unspecified: Secondary | ICD-10-CM | POA: Diagnosis not present

## 2020-01-07 ENCOUNTER — Encounter: Payer: Self-pay | Admitting: Family Medicine

## 2020-01-08 ENCOUNTER — Ambulatory Visit: Payer: Medicaid Other | Admitting: Family Medicine

## 2020-01-25 ENCOUNTER — Ambulatory Visit: Payer: Medicaid Other | Admitting: Family Medicine

## 2020-02-07 ENCOUNTER — Telehealth: Payer: Self-pay | Admitting: Family Medicine

## 2020-02-07 NOTE — Telephone Encounter (Signed)
Unable to leave a message.   If she calls back, please advise her the following:  She and her baby has an appointment with me tomorrow. Please remind her of her appointment time and advise that she arrives @ 10:20 if possible to allow to for CMA to prep her and the baby for their visit. Thanks.

## 2020-02-08 ENCOUNTER — Other Ambulatory Visit: Payer: Self-pay

## 2020-02-08 ENCOUNTER — Encounter: Payer: Self-pay | Admitting: Family Medicine

## 2020-02-08 ENCOUNTER — Ambulatory Visit (INDEPENDENT_AMBULATORY_CARE_PROVIDER_SITE_OTHER): Payer: Medicaid Other | Admitting: Family Medicine

## 2020-02-08 VITALS — BP 110/62 | Ht 60.0 in | Wt 125.5 lb

## 2020-02-08 DIAGNOSIS — R748 Abnormal levels of other serum enzymes: Secondary | ICD-10-CM

## 2020-02-08 DIAGNOSIS — D619 Aplastic anemia, unspecified: Secondary | ICD-10-CM | POA: Diagnosis not present

## 2020-02-08 DIAGNOSIS — Z0000 Encounter for general adult medical examination without abnormal findings: Secondary | ICD-10-CM

## 2020-02-08 DIAGNOSIS — Z1159 Encounter for screening for other viral diseases: Secondary | ICD-10-CM

## 2020-02-08 NOTE — Patient Instructions (Signed)
It was nice seeing you today. I am sorry you still have some spotting. This could be normal up till 6 weeks after birth and could also be in the settings of your Depo shot. If this persists or worsens in the next few weeks please see me back.

## 2020-02-08 NOTE — Progress Notes (Signed)
Subjective:     Kathleen Morrow is a 24 y.o. female and is here for a comprehensive physical exam. The patient reports Intermittent spotting and light bleed since she got her Depo shot. + mild vagina spotting now, looks brownish.. No clots No fatigue, but feels tired sometimes. Social History   Socioeconomic History   Marital status: Single    Spouse name: Not on file   Number of children: Not on file   Years of education: Not on file   Highest education level: Not on file  Occupational History   Occupation: self employed- certified Event organiser   Tobacco Use   Smoking status: Former Smoker   Smokeless tobacco: Never Used  Scientific laboratory technician Use: Never used  Substance and Sexual Activity   Alcohol use: No   Drug use: No    Comment: THC positive during pregnancy and at admission   Sexual activity: Yes  Other Topics Concern   Not on file  Social History Narrative   Not on file   Social Determinants of Health   Financial Resource Strain:    Difficulty of Paying Living Expenses: Not on file  Food Insecurity: No Food Insecurity   Worried About [place] in the Last Year: Never true   [place] in the Last Year: Never true  Transportation Needs: No Transportation Needs   Lack of Transportation (Medical): No   Lack of Transportation (Non-Medical): No  Physical Activity:    Days of Exercise per Week: Not on file   Minutes of Exercise per Session: Not on file  Stress:    Feeling of Stress : Not on file  Social Connections:    Frequency of Communication with Friends and Family: Not on file   Frequency of Social Gatherings with Friends and Family: Not on file   Attends Religious Services: Not on file   Active Member of [place] or Organizations: Not on file   Attends Archivist Meetings: Not on file   Marital Status: Not on file  Intimate Partner Violence:    Fear of Current or Ex-Partner: Not on file   Emotionally  Abused: Not on file   Physically Abused: Not on file   Sexually Abused: Not on file   Health Maintenance  Topic Date Due   Hepatitis C Screening  Never done   INFLUENZA VACCINE  03/02/2020 (Originally 12/02/2019)   COVID-19 Vaccine (1) 03/02/2020 (Originally 04/07/2010)   PAP-Cervical Cytology Screening  05/08/2022   PAP SMEAR-Modifier  05/08/2022   TETANUS/TDAP  10/22/2029   HIV Screening  Completed    The following portions of the patient's history were reviewed and updated as appropriate: allergies, current medications, past family history, past medical history, past social history, past surgical history and problem list.  Review of Systems Pertinent items noted in HPI and remainder of comprehensive ROS otherwise negative.   Objective:    BP 110/62    Ht 5' (1.524 m)    Wt 125 lb 8 oz (56.9 kg)    BMI 24.51 kg/m  General appearance: alert, cooperative and appears stated age Head: Normocephalic, without obvious abnormality, atraumatic Ears: normal TM's and external ear canals both ears Back: symmetric, no curvature. ROM normal. No CVA tenderness. Lungs: clear to auscultation bilaterally Heart: regular rate and rhythm, S1, S2 normal, no murmur, click, rub or gallop Abdomen: soft, non-tender; bowel sounds normal; no masses,  no organomegaly Pelvic: She declined exam today Extremities: extremities normal, atraumatic, no  cyanosis or edema Pulses: 2+ and symmetric Skin: Skin color, texture, turgor normal. No rashes or lesions Lymph nodes: Cervical, supraclavicular, and axillary nodes normal. Neurologic: Alert and oriented X 3, normal strength and tone. Normal symmetric reflexes. Normal coordination and gait     Edinburgh Postnatal Depression Scale - 02/08/20 1056      Edinburgh Postnatal Depression Scale:  In the Past 7 Days   I have been able to laugh and see the funny side of things. 0    I have looked forward with enjoyment to things. 0    I have blamed myself  unnecessarily when things went wrong. 0    I have been anxious or worried for no good reason. 0    I have felt scared or panicky for no good reason. 0    Things have been getting on top of me. 0    I have been so unhappy that I have had difficulty sleeping. 0    I have felt sad or miserable. 0    I have been so unhappy that I have been crying. 0    The thought of harming myself has occurred to me. 0    Edinburgh Postnatal Depression Scale Total 0           Assessment:    Healthy female exam.  Vaginal spotting  Plan:  In good health in general. Negative depression screening. She made good connection with her baby. Hep C screening completed today. She declined flu shot and COVID-19 shot today. She is up to date with PAP screening. Recent GC/Chlamydia in August was negative. Hx of anemia in pregnancy. Repeat CBC given persistent spotting and intermittent tiredness. Also rechecked her Cmet given previous elevated Alk phos. Doing well otherwise.  Vagina spotting: I discussed Lochia up to 6 weeks post-partum and irregular period related to Depo shot. She declined pelvic exam today. Return precaution discussed. F/U in 4 weeks for reassessment.   See After Visit Summary for Counseling Recommendations

## 2020-02-29 ENCOUNTER — Ambulatory Visit: Payer: Medicaid Other | Admitting: Family Medicine

## 2020-03-12 ENCOUNTER — Ambulatory Visit: Payer: Medicaid Other | Admitting: Family Medicine

## 2020-03-21 ENCOUNTER — Other Ambulatory Visit: Payer: Self-pay

## 2020-03-21 ENCOUNTER — Ambulatory Visit (INDEPENDENT_AMBULATORY_CARE_PROVIDER_SITE_OTHER): Payer: Medicaid Other

## 2020-03-21 DIAGNOSIS — Z3042 Encounter for surveillance of injectable contraceptive: Secondary | ICD-10-CM

## 2020-03-21 LAB — POCT URINE PREGNANCY: Preg Test, Ur: NEGATIVE

## 2020-03-21 MED ORDER — MEDROXYPROGESTERONE ACETATE 150 MG/ML IM SUSP
150.0000 mg | Freq: Once | INTRAMUSCULAR | Status: AC
  Administered 2020-03-21: 150 mg via INTRAMUSCULAR

## 2020-03-21 NOTE — Progress Notes (Signed)
Patient here today for Depo Provera injection and is not within her dates. Urine pregnancy obtained with negative results. Patient reports sexual activity within the last two weeks. Recommended patient to retest in two weeks (either in nurse clinic or with home test) as test today could be inaccurate.   Last contraceptive appt was 02/08/2020  Depo given in RUOQ today.  Site unremarkable & patient tolerated injection.    Next injection due 2/4-2/18/21.  Reminder card given.    Veronda Prude, RN

## 2020-03-25 ENCOUNTER — Other Ambulatory Visit (HOSPITAL_COMMUNITY)
Admission: RE | Admit: 2020-03-25 | Discharge: 2020-03-25 | Disposition: A | Discharge status: Home / Self Care | Payer: Medicaid Other | Source: Ambulatory Visit | Attending: Family Medicine | Admitting: Family Medicine

## 2020-03-25 ENCOUNTER — Encounter: Payer: Self-pay | Admitting: Family Medicine

## 2020-03-25 ENCOUNTER — Other Ambulatory Visit: Payer: Self-pay

## 2020-03-25 ENCOUNTER — Other Ambulatory Visit: Payer: Self-pay | Admitting: Family Medicine

## 2020-03-25 ENCOUNTER — Ambulatory Visit (INDEPENDENT_AMBULATORY_CARE_PROVIDER_SITE_OTHER): Payer: Medicaid Other | Admitting: Family Medicine

## 2020-03-25 VITALS — BP 110/68 | HR 74 | Wt 121.0 lb

## 2020-03-25 DIAGNOSIS — N898 Other specified noninflammatory disorders of vagina: Secondary | ICD-10-CM | POA: Diagnosis not present

## 2020-03-25 DIAGNOSIS — Z1159 Encounter for screening for other viral diseases: Secondary | ICD-10-CM

## 2020-03-25 DIAGNOSIS — Z2821 Immunization not carried out because of patient refusal: Secondary | ICD-10-CM | POA: Insufficient documentation

## 2020-03-25 DIAGNOSIS — R102 Pelvic and perineal pain: Secondary | ICD-10-CM

## 2020-03-25 DIAGNOSIS — Z124 Encounter for screening for malignant neoplasm of cervix: Secondary | ICD-10-CM

## 2020-03-25 LAB — POCT WET PREP (WET MOUNT)
Clue Cells Wet Prep Whiff POC: POSITIVE
Trichomonas Wet Prep HPF POC: ABSENT

## 2020-03-25 LAB — POCT URINE PREGNANCY: Preg Test, Ur: NEGATIVE

## 2020-03-25 MED ORDER — DOXYCYCLINE HYCLATE 100 MG PO TABS
100.0000 mg | ORAL_TABLET | Freq: Two times a day (BID) | ORAL | 0 refills | Status: AC
Start: 2020-03-25 — End: 2020-04-08

## 2020-03-25 MED ORDER — CEFTRIAXONE SODIUM 1 G IJ SOLR
0.5000 g | Freq: Once | INTRAMUSCULAR | Status: AC
  Administered 2020-03-25: 0.5 g via INTRAMUSCULAR

## 2020-03-25 MED ORDER — CEFTRIAXONE SODIUM 500 MG IJ SOLR: 500.0000 mg | Freq: Once | INTRAMUSCULAR | Status: DC

## 2020-03-25 MED ORDER — METRONIDAZOLE 500 MG PO TABS: 500.0000 mg | ORAL_TABLET | Freq: Two times a day (BID) | ORAL | 0 refills | Status: AC

## 2020-03-25 NOTE — Patient Instructions (Signed)
We are going to check some labs today. Those results will come back in the next couple of days. I have sent a medication called doxycycline to the pharmacy. Take this twice a day for 14 days. Please do not start taking this until we confirm that you are not pregnant. I will call you this evening with the results. We also gave you an injection of antibiotics as well.  If you develop any fever, nausea, vomiting, severe abdominal pain, please go straight to the emergency room.

## 2020-03-25 NOTE — Progress Notes (Signed)
I went in to discuss covid 19 vaccination and flu shot with her. She declined both and will not like to get them.  Discussed Hep C screening. Amendable to getting it done today. Test ordered.

## 2020-03-27 DIAGNOSIS — R102 Pelvic and perineal pain: Secondary | ICD-10-CM | POA: Insufficient documentation

## 2020-03-27 DIAGNOSIS — Z124 Encounter for screening for malignant neoplasm of cervix: Secondary | ICD-10-CM | POA: Insufficient documentation

## 2020-03-27 NOTE — Progress Notes (Signed)
    SUBJECTIVE:  CHIEF COMPLAINT / HPI:   Vaginal discharge Patient reports vaginal discharge and mild, non-fishy odor for the last week. Patient reports that the odor has been very thin in consistency and it has been soaking through her underwear and pants. She has to change her undergarments multiple times a day.  Patient is sexually active and received depo on 03/21/20. She does not use barrier protection. She has been with the same partner. She denies dyspareunia.  Patient reports that she has had the normal urge to void without any dysuria. Patient just gave birth about three months ago. Normal vaginal delivery. No complications.  Labor and delivery note notes bilateral hemostatic periurethral lacerations that did not require any repair.  Screening for cervical cancer Obtaining pap smear today   Hepatitis C screening   PERTINENT  PMH / PSH: [id number]   Patient Active Problem List   Diagnosis Date Noted  . Adnexal tenderness, right 03/27/2020  . Screening for cervical cancer 03/27/2020  . COVID-19 vaccination declined 03/25/2020  . Influenza vaccination declined by patient 03/25/2020  . Vaginal discharge 01/09/2019  . 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 ASTHMA, PERSISTENT 06/30/2006    OBJECTIVE:  BP 110/68   Pulse 74   Wt 121 lb (54.9 kg)   LMP 03/24/2020 (Exact Date)   Breastfeeding No   BMI 23.63 kg/m   GU: External vulva and vagina nonerythematous, without any obvious lesions or rash.  thin, watery-like clear discharge appreciated in the vaginal canal. Saturates two fox swabs. Unclear source. No obvious odor. Normal ruggae of vaginal walls.  Cervix is non erythematous and non-friable. On further inspection, urethra is intact and there is no leakage appreciated.  Anterior wall of vaginal canal is palpated with no obvious abnormalities.  Anterior wall feels intact. On bimanual exam, patient has tender in the right adnexa but no obvious mass appreciated. She is mild cervical motion  tenderness. Her uterus is firm with no obvious masses. No left adnexal tenderness.   Abdomen: Positive bowel sounds.  Mild tenderness to palpation of right pelvic area.   ASSESSMENT/PLAN:  Adnexal tenderness, right Patient reports that she has not had any obvious adnexal tenderness or dyspareunia prior to exam today.  She denies any other abdominal pain.  She does report that during the bimanual exam, the pain was similar to previous ovarian cyst.  This is unlikely ovarian torsion and likely pain due to cyst.  Patient provided return precautions for the emergency department.  We will continue to monitor at this time.  Vaginal discharge Unclear source of watery discharge.  No obvious fistula appreciated on physical exam.  Fistula also less likely as patient reports normal urges to void making a persistent leak from the bladder unlikely. Given mild cervical motion tenderness, will treat empirically for PID with doxycycline and IM ceftriaxone for PID.    Wet prep is significant for clue cells and will also treat patient for bacterial vaginosis with metronidazole.  If no improvement with watery discharge with initial empiric treatment, patient should return to the clinic for further evaluation.  Screening for cervical cancer Pap collected today     Melene Plan, MD [facility]

## 2020-03-27 NOTE — Assessment & Plan Note (Signed)
Unclear source of watery discharge.  No obvious fistula appreciated on physical exam.  Fistula also less likely as patient reports normal urges to void making a persistent leak from the bladder unlikely. Given mild cervical motion tenderness, will treat empirically for PID with doxycycline and IM ceftriaxone for PID.    Wet prep is significant for clue cells and will also treat patient for bacterial vaginosis with metronidazole.  If no improvement with watery discharge with initial empiric treatment, patient should return to the clinic for further evaluation.

## 2020-03-27 NOTE — Assessment & Plan Note (Signed)
Patient reports that she has not had any obvious adnexal tenderness or dyspareunia prior to exam today.  She denies any other abdominal pain.  She does report that during the bimanual exam, the pain was similar to previous ovarian cyst.  This is unlikely ovarian torsion and likely pain due to cyst.  Patient provided return precautions for the emergency department.  We will continue to monitor at this time.

## 2020-03-27 NOTE — Assessment & Plan Note (Signed)
Pap collected today. 

## 2020-04-02 LAB — CYTOLOGY - PAP
Adequacy: ABNORMAL
Chlamydia: NEGATIVE
Comment: NEGATIVE
Comment: NORMAL
Neisseria Gonorrhea: NEGATIVE

## 2020-04-03 ENCOUNTER — Encounter: Payer: Self-pay | Admitting: Family Medicine

## 2020-04-03 DIAGNOSIS — Z124 Encounter for screening for malignant neoplasm of cervix: Secondary | ICD-10-CM | POA: Insufficient documentation

## 2020-04-07 ENCOUNTER — Other Ambulatory Visit: Payer: Self-pay | Admitting: Family Medicine

## 2020-04-07 ENCOUNTER — Other Ambulatory Visit: Payer: Medicaid Other

## 2020-04-07 DIAGNOSIS — Z9189 Other specified personal risk factors, not elsewhere classified: Secondary | ICD-10-CM

## 2020-04-07 DIAGNOSIS — Z114 Encounter for screening for human immunodeficiency virus [HIV]: Secondary | ICD-10-CM

## 2020-04-07 DIAGNOSIS — Z1159 Encounter for screening for other viral diseases: Secondary | ICD-10-CM

## 2020-04-08 ENCOUNTER — Encounter: Payer: Self-pay | Admitting: Family Medicine

## 2020-06-03 ENCOUNTER — Encounter: Payer: Medicaid Other | Admitting: Family Medicine

## 2020-06-24 ENCOUNTER — Ambulatory Visit: Payer: Medicaid Other

## 2020-06-26 ENCOUNTER — Other Ambulatory Visit: Payer: Self-pay

## 2020-06-26 ENCOUNTER — Ambulatory Visit (INDEPENDENT_AMBULATORY_CARE_PROVIDER_SITE_OTHER): Payer: Medicaid Other

## 2020-06-26 DIAGNOSIS — Z3042 Encounter for surveillance of injectable contraceptive: Secondary | ICD-10-CM | POA: Diagnosis not present

## 2020-06-26 LAB — POCT URINE PREGNANCY: Preg Test, Ur: NEGATIVE

## 2020-06-26 MED ORDER — MEDROXYPROGESTERONE ACETATE 150 MG/ML IM SUSP
150.0000 mg | Freq: Once | INTRAMUSCULAR | Status: AC
  Administered 2020-06-26: 150 mg via INTRAMUSCULAR

## 2020-06-26 NOTE — Progress Notes (Signed)
Patient here today for Depo Provera injection and is NOT within her dates. Urine pregnancy obtained with negative result.   Last contraceptive appt was 02/08/20  Depo given in LUOQ today.  Site unremarkable & patient tolerated injection.    Next injection due 5/12-5/26.  Reminder card given.    Veronda Prude, RN

## 2020-07-02 ENCOUNTER — Ambulatory Visit: Payer: Medicaid Other

## 2020-08-01 ENCOUNTER — Ambulatory Visit: Payer: Medicaid Other | Admitting: Family Medicine

## 2020-09-01 ENCOUNTER — Ambulatory Visit: Payer: Medicaid Other

## 2020-09-01 NOTE — Progress Notes (Deleted)
    SUBJECTIVE:   CHIEF COMPLAINT / HPI:    PERTINENT  PMH / PSH:   OBJECTIVE:   There were no vitals taken for this visit.  ***  ASSESSMENT/PLAN:   No problem-specific Assessment & Plan notes found for this encounter.     Corney Knighton N Gift Rueckert, DO  [facility]   {    This will disappear when note is signed, click to select method of visit    :1}  

## 2020-09-09 NOTE — Progress Notes (Signed)
    SUBJECTIVE:   CHIEF COMPLAINT / HPI:   STD Testing  Patient presenting today for STD testing. She is currently sexually active with males  and reports 1 current sexual partner.. Reports history of STDS.  Contraception: Patient had her first shot of Depo 06/26/20.  She is due for her next shot tomorrow.  Patient reports that she has had abnormal bleeding due to the Depo and is unsure if she wants to get it again.  She does not desire pregnancy at this time. She is not sure what she would like to do instead.  Denies any discharge, genital lesions, dysuria, dyspareunia.  No LMP recorded. Patient has had an injection.. Last pap was normal.  Denies partner violence and reports feeling safe in relationships.  PERTINENT  PMH / PSH: No history of abnormal pap smears.   OBJECTIVE:  BP 100/64   Pulse 90   Ht 5' (1.524 m)   Wt 116 lb (52.6 kg)   SpO2 100%   BMI 22.65 kg/m   General: well appearing, NAD GU: External vulva and vagina nonerythematous, without any obvious lesions or rash.  No discharge appreciated.  Normal ruggae of vaginal walls.  Cervix is non erythematous and non-friable.  There is no cervical motion tenderness, masses or gross abnormalities appreciated during bimanual exam.   ASSESSMENT/PLAN:   Screening examination for STD (sexually transmitted disease) Obtained GC/C, wet prep.  Patient declined HIV, RPR, hep C.  She reports that she will follow-up for her Depo injection and will consider obtaining these labs at that time. Wet prep + BV. Metronidazole sent to pharmacy and patient notified via phone call.   Encounter for other general counseling and advice on contraception Patient is unsure if she wants to get her repeat Depo shot given some abnormal bleeding that she has experienced.  She denies any heavy bleeding but reports that has been a nuisance.  She does not desire pregnancy at this time.  We discussed that abnormal bleeding is expected when starting a new birth  control.  She will think further about birth control options but has no decisions today.  She reports she will probably come get her Depo. -Follow-up at next exam   Melene Plan, MD [facility]

## 2020-09-10 ENCOUNTER — Ambulatory Visit (INDEPENDENT_AMBULATORY_CARE_PROVIDER_SITE_OTHER): Payer: Medicaid Other | Admitting: Family Medicine

## 2020-09-10 ENCOUNTER — Other Ambulatory Visit (HOSPITAL_COMMUNITY)
Admission: RE | Admit: 2020-09-10 | Discharge: 2020-09-10 | Disposition: A | Discharge status: Home / Self Care | Payer: Medicaid Other | Source: Ambulatory Visit | Attending: Family Medicine | Admitting: Family Medicine

## 2020-09-10 ENCOUNTER — Encounter: Payer: Self-pay | Admitting: Family Medicine

## 2020-09-10 ENCOUNTER — Other Ambulatory Visit: Payer: Self-pay

## 2020-09-10 VITALS — BP 100/64 | HR 90 | Ht 60.0 in | Wt 116.0 lb

## 2020-09-10 DIAGNOSIS — B9689 Other specified bacterial agents as the cause of diseases classified elsewhere: Secondary | ICD-10-CM | POA: Diagnosis not present

## 2020-09-10 DIAGNOSIS — Z113 Encounter for screening for infections with a predominantly sexual mode of transmission: Secondary | ICD-10-CM | POA: Insufficient documentation

## 2020-09-10 DIAGNOSIS — N760 Acute vaginitis: Secondary | ICD-10-CM

## 2020-09-10 DIAGNOSIS — Z3009 Encounter for other general counseling and advice on contraception: Secondary | ICD-10-CM | POA: Diagnosis not present

## 2020-09-10 LAB — POCT WET PREP (WET MOUNT)
Clue Cells Wet Prep Whiff POC: POSITIVE
Trichomonas Wet Prep HPF POC: ABSENT

## 2020-09-10 MED ORDER — TRIAMCINOLONE ACETONIDE 0.1 % EX CREA: 1.0000 "application " | TOPICAL_CREAM | Freq: Two times a day (BID) | CUTANEOUS | 0 refills | Status: DC

## 2020-09-10 MED ORDER — METRONIDAZOLE 500 MG PO TABS: 500.0000 mg | ORAL_TABLET | Freq: Two times a day (BID) | ORAL | 0 refills | Status: AC

## 2020-09-10 NOTE — Assessment & Plan Note (Signed)
Metronidazole sent to pharmacy. 

## 2020-09-10 NOTE — Assessment & Plan Note (Addendum)
Obtained GC/C, wet prep.  Patient declined HIV, RPR, hep C.  She reports that she will follow-up for her Depo injection and will consider obtaining these labs at that time. Wet prep + BV. Metronidazole sent to pharmacy and patient notified via phone call.

## 2020-09-10 NOTE — Progress Notes (Signed)
Patient ID: Kathleen Morrow, female   DOB: 02-05-1996, 25 y.o.   MRN: [id number] Brief check in with patient.  COVID-19 and HPV vaccines recommended and was offered. She deferred both today. I will readdress at her next appointment with me.

## 2020-09-10 NOTE — Assessment & Plan Note (Signed)
Patient is unsure if she wants to get her repeat Depo shot given some abnormal bleeding that she has experienced.  She denies any heavy bleeding but reports that has been a nuisance.  She does not desire pregnancy at this time.  We discussed that abnormal bleeding is expected when starting a new birth control.  She will think further about birth control options but has no decisions today.  She reports she will probably come get her Depo. -Follow-up at next exam

## 2020-09-11 LAB — CERVICOVAGINAL ANCILLARY ONLY
Chlamydia: NEGATIVE
Comment: NEGATIVE
Comment: NORMAL
Neisseria Gonorrhea: NEGATIVE

## 2020-09-16 ENCOUNTER — Encounter: Payer: Self-pay | Admitting: Family Medicine

## 2020-10-09 ENCOUNTER — Ambulatory Visit (INDEPENDENT_AMBULATORY_CARE_PROVIDER_SITE_OTHER): Payer: Medicaid Other

## 2020-10-09 ENCOUNTER — Other Ambulatory Visit: Payer: Self-pay

## 2020-10-09 DIAGNOSIS — Z3042 Encounter for surveillance of injectable contraceptive: Secondary | ICD-10-CM | POA: Diagnosis not present

## 2020-10-09 LAB — POCT URINE PREGNANCY: Preg Test, Ur: NEGATIVE

## 2020-10-09 MED ORDER — MEDROXYPROGESTERONE ACETATE 150 MG/ML IM SUSP
150.0000 mg | Freq: Once | INTRAMUSCULAR | Status: AC
  Administered 2020-10-09: 150 mg via INTRAMUSCULAR

## 2020-10-09 NOTE — Progress Notes (Signed)
Patient here today for Depo Provera injection and is not within her dates. Advised that patient follow up with repeat pregnancy test in two weeks if she has been sexually active in the last month. Instructed patient to use protection for at least 7 days.   Last contraceptive appt was 09/10/2020  Depo given in RUOQ today.  Site unremarkable & patient tolerated injection.    Next injection due 8/25-9/8.  Reminder card given.    Veronda Prude, RN

## 2020-10-20 ENCOUNTER — Ambulatory Visit: Payer: Medicaid Other | Admitting: Student in an Organized Health Care Education/Training Program

## 2020-10-24 ENCOUNTER — Ambulatory Visit: Payer: Medicaid Other

## 2020-10-24 NOTE — Progress Notes (Deleted)
     SUBJECTIVE:   CHIEF COMPLAINT / HPI:   Kathleen Morrow is a 25 y.o. female presents for painful lump on labia  Painful lump Pt noticed lump *** days ago. She denies vaginal pruritis, abnormal vaginal bleeding, dysuria, hematuria, frequency, abdominal pain pelvic pain, nausea, vomiting, fevers or new rash.   She has used *** with *** relief.   *** history of STIs.  She *** sexually active and *** use condoms.  Contraception: ***.  LMP ***.  She does not douche.   ***  Flowsheet Row Office Visit from 09/10/2020 in [facility]  PHQ-9 Total Score 0        Health Maintenance Due  Topic   COVID-19 Vaccine (1)   Pneumococcal Vaccine 60-50 Years old (1 - PCV)   HPV VACCINES (2 - 3-dose series)   Hepatitis C Screening       PERTINENT  PMH / PSH: BV, asthma   OBJECTIVE:   There were no vitals taken for this visit.   General: Alert, no acute distress Cardio: Normal S1 and S2, RRR, no r/m/g Pulm: CTAB, normal work of breathing Abdomen: Bowel sounds normal. Abdomen soft and non-tender.  Extremities: No peripheral edema.  Neuro: Cranial nerves grossly intact    Pelvic Exam chaperoned by CMA ***         External: normal female genitalia without lesions or masses        Vagina: normal without lesions or masses        Cervix: normal without lesions or masses        Pap smear: performed        Samples for Wet prep, GC/Chlamydia obtained   ASSESSMENT/PLAN:   No problem-specific Assessment & Plan notes found for this encounter.     Towanda Octave, MD PGY-2 [facility]

## 2020-12-25 ENCOUNTER — Ambulatory Visit: Payer: Medicaid Other

## 2020-12-25 NOTE — Progress Notes (Deleted)
    SUBJECTIVE:   CHIEF COMPLAINT / HPI: First COVID vaccine    PERTINENT  PMH / PSH:  None  OBJECTIVE:   There were no vitals taken for this visit.   General: Alert, no acute distress Cardio: Normal S1 and S2, RRR, no r/m/g Pulm: CTAB, normal work of breathing Abdomen: Bowel sounds normal. Abdomen soft and non-tender.  Extremities: No peripheral edema.  Neuro: Cranial nerves grossly intact   ASSESSMENT/PLAN:   No problem-specific Assessment & Plan notes found for this encounter.     Dana Allan, MD [facility]

## 2021-05-03 NOTE — L&D Delivery Note (Addendum)
OB/GYN Faculty Practice Delivery Note  Kathleen Morrow is a 26 y.o. [id number] s/p SVD at [redacted]w[redacted]d. She was admitted for IOL for PD.   ROM: 11h 39m with clear fluid GBS Status:  Negative/-- (10/20 1634) Maximum Maternal Temperature:  Temp (24hrs), Avg:98 F (36.7 C), Min:97.7 F (36.5 C), Max:98.4 F (36.9 C)    Labor Progress: Patient arrived at 3.5 cm dilation and was induced with pitocin.   Delivery Date/Time: 03/16/2022 at 2358 Delivery: Called to room and patient was complete and infant was at +2 station. Patient felt pressure and was instructed to push. Head delivered in direct OP position. No nuchal cord present. Applied downward traction and required hooking of the posterior axilla and cork screwing fetus clockwise. Body delivered in usual fashion. Infant with spontaneous cry, placed on mother's abdomen, dried and stimulated. Cord clamped x 2 after 1-minute delay, and cut by FOB. Cord blood drawn. Placenta delivered spontaneously with gentle cord traction. Fundus firm with massage and Pitocin. Labia, perineum, vagina, and cervix inspected.   Placenta: Delivered intact Complications: None Lacerations: bilateral periurethral tears, hemostatic not needing repair  EBL: 13cc  Analgesia: Epidural    Infant: APGAR (1 MIN):  8 APGAR (5 MINS):  9 APGAR (10 MINS):    Weight: pending   Lockie Mola, MD  PGY-1, [facility]  03/16/2022 12:39 AM  The above was performed under my direct supervision and guidance.

## 2021-07-21 ENCOUNTER — Encounter: Payer: Self-pay | Admitting: Family Medicine

## 2021-07-21 ENCOUNTER — Ambulatory Visit (INDEPENDENT_AMBULATORY_CARE_PROVIDER_SITE_OTHER): Payer: Medicaid Other | Admitting: Family Medicine

## 2021-07-21 ENCOUNTER — Other Ambulatory Visit: Payer: Self-pay

## 2021-07-21 ENCOUNTER — Other Ambulatory Visit (HOSPITAL_COMMUNITY)
Admission: RE | Admit: 2021-07-21 | Discharge: 2021-07-21 | Disposition: A | Discharge status: Home / Self Care | Payer: Medicaid Other | Source: Ambulatory Visit | Attending: Family Medicine | Admitting: Family Medicine

## 2021-07-21 DIAGNOSIS — Z113 Encounter for screening for infections with a predominantly sexual mode of transmission: Secondary | ICD-10-CM | POA: Diagnosis not present

## 2021-07-21 DIAGNOSIS — D509 Iron deficiency anemia, unspecified: Secondary | ICD-10-CM

## 2021-07-21 DIAGNOSIS — R748 Abnormal levels of other serum enzymes: Secondary | ICD-10-CM | POA: Diagnosis not present

## 2021-07-21 DIAGNOSIS — R011 Cardiac murmur, unspecified: Secondary | ICD-10-CM | POA: Diagnosis not present

## 2021-07-21 DIAGNOSIS — Z331 Pregnant state, incidental: Secondary | ICD-10-CM | POA: Diagnosis not present

## 2021-07-21 DIAGNOSIS — N912 Amenorrhea, unspecified: Secondary | ICD-10-CM

## 2021-07-21 LAB — POCT WET PREP (WET MOUNT)
Clue Cells Wet Prep Whiff POC: NEGATIVE
Trichomonas Wet Prep HPF POC: ABSENT

## 2021-07-21 LAB — POCT URINE PREGNANCY: Preg Test, Ur: POSITIVE — AB

## 2021-07-21 MED ORDER — PRENATAL VITAMIN 27-0.8 MG PO TABS: 1.0000 | ORAL_TABLET | Freq: Every day | ORAL | 1 refills | Status: DC

## 2021-07-21 NOTE — Assessment & Plan Note (Signed)
During previous pregnancy. ?Repeat offered today. ?She declined due to lack of insurance coverage. ?

## 2021-07-21 NOTE — Patient Instructions (Signed)
Primary Amenorrhea Primary amenorrhea is when a female has not started having periods by the timeshe is 24 years old. What are the causes? This condition may be caused by: An abnormal chromosome that causes the ovaries not to work properly. This is a common cause. Polycystic ovary syndrome. Being born without a vagina, uterus, or ovaries. Cystic fibrosis. Pituitary gland tumor in the brain. Long-term illnesses. Cushing disease. A thyroid disease, such as hypothyroidism or hyperthyroidism. A part of the brain called the hypothalamus not working normally. Premature ovarian failure. Other causes of this condition include: Malnutrition. Extreme obesity. Low blood sugar (hypoglycemia). Drastic weight loss. Overexercising that leads to a loss of body fat. What are the signs or symptoms? The main symptom of this condition is a female not having had a period by 24 years of age. Other symptoms may include: Discharge from the breasts. Hot flashes. Adult acne. Facial or chest hair. Headaches. Impaired vision. Recent excessive stress. Changes in weight, diet, or exercise patterns. How is this diagnosed? This condition may be diagnosed based on: Your medical history. A physical exam. Blood tests. Urine tests. You may also have other tests, including: Ultrasound of the pelvis. MRI of the head. How is this treated? Treatment for this condition depends on the cause. Treatment may includelifestyle changes, surgery, or medicine. Follow these instructions at home:     Eat a healthy diet. A healthy diet includes lots of fruits and vegetables, low-fat dairy products, lean meats, and foods that contain fiber. Maintain a healthy weight. Talk to your health care provider before trying any new diet or exercise plan Get regular exercise. Aim for 30 minutes of moderate-intensity activity 5 times a week. Examples of moderate-intensity activity include walking and yoga. Be sure to talk with your  health care provider before starting any exercise routine. Take over-the-counter and prescription medicines only as told by your health care provider. Keep all follow-up visits. This is important. Contact a health care provider if: You have pelvic pain. You gain an unusual amount of weight. You have an unusual amount of hair growth. Summary Primary amenorrhea is when a female has not started having periods by the time she is 24 years old. This condition has many causes, including abnormal ovaries, obesity, extreme weight loss. Contact a health care provider if you have pelvic pain, gain an unusual amount of weight, or have an unusual amount of hair growth. This information is not intended to replace advice given to you by your health care provider. Make sure you discuss any questions you have with your healthcare provider. Document Revised: 12/05/2019 Document Reviewed: 12/05/2019 Elsevier Patient Education  2022 Elsevier Inc.  

## 2021-07-21 NOTE — Assessment & Plan Note (Signed)
This is new. ?May be related to her pregnancy due to increased blood volume. ?She is currently asymptomatic. ?ECHO mention, but will defer pending insurance coverage. ?F/U as needed for now.  ?

## 2021-07-21 NOTE — Progress Notes (Signed)
? ? ?  SUBJECTIVE:  ? ?CHIEF COMPLAINT / HPI:  ? ?STD:  ?She is sexually active with the same partner of 3 yrs. Wanted STD check as routine precautions. No new concerns. ? ?Amenorrhea:  ?LMP: [place]. She is uncertain the exact date. She has been having morning sickness. She took home pregnancy test and it was positive. Here for confirmation. ? ?Anemia: Not taking her iron supplement. ? ?Elevated Alk Phos: ?Intermittent abdominal pain only when she lay Down since pregnancy. No other GI concerns. ? ?PERTINENT  PMH / PSH: PMHx reviewed ? ?OBJECTIVE:  ? ?Vitals:  ? 07/21/21 1110  ?BP: 93/71  ?Pulse: (!) 104  ?SpO2: 100%  ?Weight: 108 lb 12.8 oz (49.4 kg)  ?Height: 5' (1.524 m)  ? ? ?Physical Exam ?Vitals and nursing note reviewed. Exam conducted with a chaperone present Lavell Anchors).  ?Cardiovascular:  ?   Rate and Rhythm: Normal rate and regular rhythm.  ?   Heart sounds: Normal heart sounds.  ?   Comments: Intermittent 1/6 systolic murmur ?Pulmonary:  ?   Effort: Pulmonary effort is normal. No respiratory distress.  ?   Breath sounds: Normal breath sounds. No wheezing.  ?Abdominal:  ?   General: Abdomen is flat. Bowel sounds are normal. There is no distension.  ?   Palpations: Abdomen is soft. There is no mass.  ?   Tenderness: There is no abdominal tenderness.  ?Genitourinary: ?   Labia:     ?   Right: No rash.     ?   Left: No rash.   ?   Vagina: Normal.  ?   Cervix: Discharge present.  ? ? ? ?ASSESSMENT/PLAN:  ?STD screening: ?GC Chlamydia tested as well as Trichomonas. I will call with her results.  ?Wet prep is negative for yeast, BV or Trich. ?HIV and RPR screening offered.  ?However, due to  lack of adequate insurance coverage, she declined blood test today. ? ?Incidental pregnancy ?Positive pregnancy test in the office. ?I discussed restarting prenatal vitamin with iron. ?I advised making an appointment today for prenatal intake. ?Prenatal care would be scheduled after her intake appointment. ?She  agreed with the plan. ? ?Iron deficiency anemia ?Not taking her iron supplement. ?She declined repeat blood work today due to Copy. ?Likely check as part of her prenatal intake. ?She prefers to wait till then. ?Prenatal vit with iron escribed. ?She agreed to resume meds. ? ?Alkaline phosphatase elevation ?During previous pregnancy. ?Repeat offered today. ?She declined due to lack of insurance coverage. ? ?Murmur ?This is new. ?May be related to her pregnancy due to increased blood volume. ?She is currently asymptomatic. ?ECHO mention, but will defer pending insurance coverage. ?F/U as needed for now.  ?  ? ?Andrena Mews, MD ?[place]  ? ?

## 2021-07-21 NOTE — Assessment & Plan Note (Addendum)
Not taking her iron supplement. 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 ?Likely check as part of her prenatal intake. ?She prefers to wait till then. ?Prenatal vit with iron escribed. ?She agreed to resume meds. ?

## 2021-07-21 NOTE — Assessment & Plan Note (Signed)
Positive pregnancy test in the office. ?I discussed restarting prenatal vitamin with iron. ?I advised making an appointment today for prenatal intake. ?Prenatal care would be scheduled after her intake appointment. ?She agreed with the plan. ?

## 2021-07-22 ENCOUNTER — Telehealth: Payer: Self-pay | Admitting: Family Medicine

## 2021-07-22 LAB — CERVICOVAGINAL ANCILLARY ONLY
Chlamydia: NEGATIVE
Comment: NEGATIVE
Comment: NEGATIVE
Comment: NORMAL
Neisseria Gonorrhea: NEGATIVE
Trichomonas: NEGATIVE

## 2021-07-22 NOTE — Telephone Encounter (Signed)
Neg STD result discussed. ?No additional questions. ?

## 2021-08-03 NOTE — Progress Notes (Deleted)
?Patient Name: Kathleen Morrow ?Date of Birth: 1996/04/21 ?[facility] Initial Prenatal Visit ? ?Kathleen Morrow is a 26 y.o. year old [id number] at Unknown who presents for her initial prenatal visit. ?Pregnancy {Is/is not:9024} planned ?She reports {pregnancy symptoms:18128}. ?She {is/is not:320031::"is"} taking a prenatal vitamin.  ?She denies pelvic pain or vaginal bleeding.  ? ?Pregnancy Dating: ?The patient is dated by ***.  ?LMP: *** ?Period is certain:  0000000.  ?Periods were regular:  {yes/no:20286}.  ?LMP was a typical period:  {yes/no:20286}.  ?Using hormonal contraception in 3 months prior to conception: {yes/no:20286} ? ?Lab Review: ?Blood type: O POS ?Rh Status: {Rh status :23298::"+"} ?Antibody screen: {NEGATIVE/POSITIVE FOR:19998::"Negative"} ?HIV: {NEGATIVE/POSITIVE FOR:19998::"Negative"} ?RPR: {NEGATIVE/APPRO/POSITIVE FOR:20006::"Negative"} ?Hemoglobin electrophoresis reviewed: {yes/no:20286::"Yes"} ?Results of OB urine culture are: {NEGATIVE/POSITIVE FOR:19998::"Negative"} ?Rubella: {Desc; immune/not/unknown:31571::"Immune"} ?Hep C Ab: {NEGATIVE/POSITIVE FOR:19998::"Negative"} ?Varicella status is {Desc; immune/not/unknown:31571::"Immune"} ? ?PMH: Reviewed and as detailed below: ?HTN: {yes/no:20286::"No"}  ?Gestational Hypertension/preeclampsia: {yes/no:20286::"No"}  ?Type 1 or 2 Diabetes: {yes/no:20286::"No"}  ?Depression:  {yes/no:20286::"No"}  ?Seizure disorder:  {yes/no:20286::"No"} ?VTE: {yes/no:20286::"No"} ,  ?History of STI {yes/no:20286::"No"},  ?Abnormal Pap smear:  {yes/no:20286::"No"}, ?Genital herpes simplex:  {yes/no:20286::"No"}  ? ?PSH: ?Gynecologic Surgery:  {No/  **:31982:o:"no"} ?Surgical history reviewed, notable for: *** ? ?Obstetric History: ?Obstetric history tab updated and reviewed.  ?Summary of prior pregnancies: *** ?Cesarean delivery: {yes/no:20286::"No"}  ?Gestational Diabetes:  {yes/no:20286::"No"} ?Hypertension in pregnancy:  {yes/no:20286::"No"} ?History of preterm birth: {yes/no:20286::"No"} ?History of LGA/SGA infant:  {yes/no:20286::"No"} ?History of shoulder dystocia: {yes/no:20286::"No"} ?Indications for referral were reviewed, and the patient has no obstetric indications for referral to [facility] at this time.  ? ?Social History: ?Partner's name: ***  ?Tobacco use: {yes/no:20286::"No"} ?Alcohol use:  {yes/no:20286::"No"} ?Other substance use:  {yes/no:20286::"No"} ? ?Current Medications:  ?***  ?Reviewed and appropriate in pregnancy.  ? ?Genetic and Infection Screen: ?Flow Sheet Updated {yes/no:20286::"Yes"} ? ?Prenatal Exam: ?Gen: Well nourished, well developed.  No distress.  Vitals noted. ?HEENT: Normocephalic, atraumatic.  Neck supple without cervical lymphadenopathy, thyromegaly or thyroid nodules.  Fair dentition. ?CV: RRR no murmur, gallops or rubs ?Lungs: CTA B.  Normal respiratory effort without wheezes or rales. ?Abd: soft, NTND. +BS.  Uterus not appreciated above pelvis. ?GU: Normal external female genitalia without lesions.  Nl vaginal, well rugated without lesions. No vaginal discharge.  Bimanual exam: No adnexal mass or TTP. No CMT.  Uterus size *** ?Ext: No clubbing, cyanosis or edema. ?Psych: Normal grooming and dress.  Not depressed or anxious appearing.  Normal thought content and process without flight of ideas or looseness of associations ? ?Fetal heart tones: {appropriate:23337::"Appropriate"} ? ?Assessment/Plan: ? ?Kathleen Morrow is a 26 y.o. [phone] at Unknown who presents to initiate prenatal care. She is doing well.  ?Current pregnancy issues include ***. ? ?Routine prenatal care: ?As dating {ACTION; IS/IS NOT:[id number]::"is not"} reliable, a dating ultrasound {HAS HAS NOT:18834::"has"} been ordered. Dating tab updated. ?Pre-pregnancy weight updated. Expected weight gain this pregnancy is {weight gain pregnancy :23296::"25-35 pounds "} ?Prenatal labs reviewed, notable for ***. ?Indications  for referral to [facility] were reviewed and the patient {DOES NOT does:27190::"does not"} meet criteria for referral.  ?Medication list reviewed and updated.  ?Recommended patient see a dentist for regular care.  ?Bleeding and pain precautions reviewed. ?Importance of prenatal vitamins reviewed.  ?Genetic screening offered. Patient opted for: {obgeneticscreen:23414}. ?The patient has the following indications for aspirinto begin 81 mg at 12-16 weeks: ?One high risk condition: {fmcaspirinobhigh:26167} ?MORE than one moderate risk condition: {fmcaspirinobmoderate:26168} ?Aspirin {WAS/WAS NOT:[phone]::"was not"}  recommended today based upon above risk factors (one high risk condition or more than one moderate risk factor)  ?The patient {will/will not be:23415} age 44 or over at time of delivery. Referral to genetic counseling {WAS/WAS NOT:[phone]::"was not"} offered today.  ?The patient has the following risk factors for preexisting diabetes: {Pre-existing diabetes screening:23343::"Reviewed indications for early 1 hour glucose testing, not indicated "}. An early 1 hour glucose tolerance test {WAS/WAS NOT:[phone]::"was not"} ordered. ?Pregnancy Medical Home and PHQ-9 forms completed, problems noted: {yes/no:20286} ? ?2. Pregnancy issues include the following which were addressed today:  ?*** ?  ?Follow up 4 weeks for next prenatal visit. ? ? ? ? ?

## 2021-08-04 ENCOUNTER — Encounter: Payer: Medicaid Other | Admitting: Student

## 2021-09-14 ENCOUNTER — Encounter: Payer: Medicaid Other | Admitting: Student

## 2021-09-14 NOTE — Progress Notes (Deleted)
?Patient Name: Kathleen Morrow ?Date of Birth: 07/19/1995 ?[facility] Initial Prenatal Visit ? ?Kathleen Morrow is a 26 y.o. year old G4P1031 at Unknown who presents for her initial prenatal visit. ?Pregnancy {Is/is not:9024} planned ?She reports {pregnancy symptoms:18128}. ?She {is/is not:320031::"is"} taking a prenatal vitamin.  ?She denies pelvic pain or vaginal bleeding.  ? ?Pregnancy Dating: ?The patient is dated by ***.  ?LMP: *** ?Period is certain:  {yes/no:20286}.  ?Periods were regular:  {yes/no:20286}.  ?LMP was a typical period:  {yes/no:20286}.  ?Using hormonal contraception in 3 months prior to conception: {yes/no:20286} ? ?Lab Review: ?Blood type: O POS ?Rh Status: {Rh status :23298::"+"} ?Antibody screen: {NEGATIVE/POSITIVE FOR:19998::"Negative"} ?HIV: {NEGATIVE/POSITIVE FOR:19998::"Negative"} ?RPR: {NEGATIVE/APPRO/POSITIVE FOR:20006::"Negative"} ?Hemoglobin electrophoresis reviewed: {yes/no:20286::"Yes"} ?Results of OB urine culture are: {NEGATIVE/POSITIVE FOR:19998::"Negative"} ?Rubella: {Desc; immune/not/unknown:31571::"Immune"} ?Hep C Ab: {NEGATIVE/POSITIVE FOR:19998::"Negative"} ?Varicella status is {Desc; immune/not/unknown:31571::"Immune"} ? ?PMH: Reviewed and as detailed below: ?HTN: {yes/no:20286::"No"}  ?Gestational Hypertension/preeclampsia: {yes/no:20286::"No"}  ?Type 1 or 2 Diabetes: {yes/no:20286::"No"}  ?Depression:  {yes/no:20286::"No"}  ?Seizure disorder:  {yes/no:20286::"No"} ?VTE: {yes/no:20286::"No"} ,  ?History of STI {yes/no:20286::"No"},  ?Abnormal Pap smear:  {yes/no:20286::"No"}, ?Genital herpes simplex:  {yes/no:20286::"No"}  ? ?PSH: ?Gynecologic Surgery:  {No/  **:31982:o:"no"} ?Surgical history reviewed, notable for: *** ? ?Obstetric History: ?Obstetric history tab updated and reviewed.  ?Summary of prior pregnancies: *** ?Cesarean delivery: {yes/no:20286::"No"}  ?Gestational Diabetes:  {yes/no:20286::"No"} ?Hypertension in pregnancy:  {yes/no:20286::"No"} ?History of preterm birth: {yes/no:20286::"No"} ?History of LGA/SGA infant:  {yes/no:20286::"No"} ?History of shoulder dystocia: {yes/no:20286::"No"} ?Indications for referral were reviewed, and the patient has no obstetric indications for referral to High Risk [facility] Clinic at this time.  ? ?Social History: ?Partner's name: ***  ?Tobacco use: {yes/no:20286::"No"} ?Alcohol use:  {yes/no:20286::"No"} ?Other substance use:  {yes/no:20286::"No"} ? ?Current Medications:  ?***  ?Reviewed and appropriate in pregnancy.  ? ?Genetic and Infection Screen: ?Flow Sheet Updated {yes/no:20286::"Yes"} ? ?Prenatal Exam: ?Gen: Well nourished, well developed.  No distress.  Vitals noted. ?HEENT: Normocephalic, atraumatic.  Neck supple without cervical lymphadenopathy, thyromegaly or thyroid nodules.  Fair dentition. ?CV: RRR no murmur, gallops or rubs ?Lungs: CTA B.  Normal respiratory effort without wheezes or rales. ?Abd: soft, NTND. +BS.  Uterus not appreciated above pelvis. ?GU: Normal external female genitalia without lesions.  Nl vaginal, well rugated without lesions. No vaginal discharge.  Bimanual exam: No adnexal mass or TTP. No CMT.  Uterus size *** ?Ext: No clubbing, cyanosis or edema. ?Psych: Normal grooming and dress.  Not depressed or anxious appearing.  Normal thought content and process without flight of ideas or looseness of associations ? ?Fetal heart tones: {appropriate:23337::"Appropriate"} ? ?Assessment/Plan: ? ?Kathleen Morrow is a 26 y.o. G4P1031 at Unknown who presents to initiate prenatal care. She is doing well.  ?Current pregnancy issues include ***. ? ?Routine prenatal care: ?As dating {ACTION; IS/IS NOT:21021397::"is not"} reliable, a dating ultrasound {HAS HAS NOT:18834::"has"} been ordered. Dating tab updated. ?Pre-pregnancy weight updated. Expected weight gain this pregnancy is {weight gain pregnancy :23296::"25-35 pounds "} ?Prenatal labs reviewed, notable for ***. ?Indications  for referral to [facility] were reviewed and the patient {DOES NOT does:27190::"does not"} meet criteria for referral.  ?Medication list reviewed and updated.  ?Recommended patient see a dentist for regular care.  ?Bleeding and pain precautions reviewed. ?Importance of prenatal vitamins reviewed.  ?Genetic screening offered. Patient opted for: {obgeneticscreen:23414}. ?The patient has the following indications for aspirinto begin 81 mg at 12-16 weeks: ?One high risk condition: {fmcaspirinobhigh:26167} ?MORE than one moderate risk condition: {fmcaspirinobmoderate:26168} ?Aspirin {WAS/WAS NOT:2100118327::"was not"}    recommended today based upon above risk factors (one high risk condition or more than one moderate risk factor)  The patient {will/will not be:23415} age 24 or over at time of delivery. Referral to genetic counseling {WAS/WAS NOT:7032068475::"was not"} offered today.  The patient has the following risk factors for preexisting diabetes: {Pre-existing diabetes screening:23343::"Reviewed indications for early 1 hour glucose testing, not indicated "}. An early 1 hour glucose tolerance test {WAS/WAS NOT:7032068475::"was not"} ordered. Pregnancy Medical Home and PHQ-9 forms completed, problems noted: {yes/no:20286}  2. Pregnancy issues include the following which were addressed today:  ***   Follow up 4 weeks for next prenatal visit.

## 2021-09-18 ENCOUNTER — Telehealth: Payer: Self-pay | Admitting: Family Medicine

## 2021-09-18 ENCOUNTER — Encounter: Payer: Medicaid Other | Admitting: Student

## 2021-09-18 NOTE — Telephone Encounter (Addendum)
Patient no showed to her third new OB visit today - scheduled 3 times with Dr. Royal Piedra and has no showed each time.  Attempted to reach patient to ask if she is planning to seek prenatal care here. No answer. Left hipaa compliant vm asking her to call back.  Routing to Development worker, community as an Financial planner.  Latrelle Dodrill, MD

## 2021-09-18 NOTE — Progress Notes (Deleted)
?Patient Name: Kathleen Morrow ?Date of Birth: 11/11/1995 ?[facility] Initial Prenatal Visit ? ?Kathleen Morrow is a 25 y.o. year old G4P1031 at Unknown who presents for her initial prenatal visit. ?Pregnancy {Is/is not:9024} planned ?She reports {pregnancy symptoms:18128}. ?She {is/is not:320031::"is"} taking a prenatal vitamin.  ?She denies pelvic pain or vaginal bleeding.  ? ?Pregnancy Dating: ?The patient is dated by ***.  ?LMP: *** ?Period is certain:  {yes/no:20286}.  ?Periods were regular:  {yes/no:20286}.  ?LMP was a typical period:  {yes/no:20286}.  ?Using hormonal contraception in 3 months prior to conception: {yes/no:20286} ? ?Lab Review: ?Blood type: O POS ?Rh Status: {Rh status :23298::"+"} ?Antibody screen: {NEGATIVE/POSITIVE FOR:19998::"Negative"} ?HIV: {NEGATIVE/POSITIVE FOR:19998::"Negative"} ?RPR: {NEGATIVE/APPRO/POSITIVE FOR:20006::"Negative"} ?Hemoglobin electrophoresis reviewed: {yes/no:20286::"Yes"} ?Results of OB urine culture are: {NEGATIVE/POSITIVE FOR:19998::"Negative"} ?Rubella: {Desc; immune/not/unknown:31571::"Immune"} ?Hep C Ab: {NEGATIVE/POSITIVE FOR:19998::"Negative"} ?Varicella status is {Desc; immune/not/unknown:31571::"Immune"} ? ?PMH: Reviewed and as detailed below: ?HTN: {yes/no:20286::"No"}  ?Gestational Hypertension/preeclampsia: {yes/no:20286::"No"}  ?Type 1 or 2 Diabetes: {yes/no:20286::"No"}  ?Depression:  {yes/no:20286::"No"}  ?Seizure disorder:  {yes/no:20286::"No"} ?VTE: {yes/no:20286::"No"} ,  ?History of STI {yes/no:20286::"No"},  ?Abnormal Pap smear:  {yes/no:20286::"No"}, ?Genital herpes simplex:  {yes/no:20286::"No"}  ? ?PSH: ?Gynecologic Surgery:  {No/  **:31982:o:"no"} ?Surgical history reviewed, notable for: *** ? ?Obstetric History: ?Obstetric history tab updated and reviewed.  ?Summary of prior pregnancies: *** ?Cesarean delivery: {yes/no:20286::"No"}  ?Gestational Diabetes:  {yes/no:20286::"No"} ?Hypertension in pregnancy:  {yes/no:20286::"No"} ?History of preterm birth: {yes/no:20286::"No"} ?History of LGA/SGA infant:  {yes/no:20286::"No"} ?History of shoulder dystocia: {yes/no:20286::"No"} ?Indications for referral were reviewed, and the patient has no obstetric indications for referral to High Risk OB Clinic at this time.  ? ?Social History: ?Partner's name: ***  ?Tobacco use: {yes/no:20286::"No"} ?Alcohol use:  {yes/no:20286::"No"} ?Other substance use:  {yes/no:20286::"No"} ? ?Current Medications:  ?***  ?Reviewed and appropriate in pregnancy.  ? ?Genetic and Infection Screen: ?Flow Sheet Updated {yes/no:20286::"Yes"} ? ?Prenatal Exam: ?Gen: Well nourished, well developed.  No distress.  Vitals noted. ?HEENT: Normocephalic, atraumatic.  Neck supple without cervical lymphadenopathy, thyromegaly or thyroid nodules.  Fair dentition. ?CV: RRR no murmur, gallops or rubs ?Lungs: CTA B.  Normal respiratory effort without wheezes or rales. ?Abd: soft, NTND. +BS.  Uterus not appreciated above pelvis. ?GU: Normal external female genitalia without lesions.  Nl vaginal, well rugated without lesions. No vaginal discharge.  Bimanual exam: No adnexal mass or TTP. No CMT.  Uterus size *** ?Ext: No clubbing, cyanosis or edema. ?Psych: Normal grooming and dress.  Not depressed or anxious appearing.  Normal thought content and process without flight of ideas or looseness of associations ? ?Fetal heart tones: {appropriate:23337::"Appropriate"} ? ?Assessment/Plan: ? ?Kathleen Morrow is a 25 y.o. G4P1031 at Unknown who presents to initiate prenatal care. She is doing well.  ?Current pregnancy issues include ***. ? ?Routine prenatal care: ?As dating {ACTION; IS/IS NOT:21021397::"is not"} reliable, a dating ultrasound {HAS HAS NOT:18834::"has"} been ordered. Dating tab updated. ?Pre-pregnancy weight updated. Expected weight gain this pregnancy is {weight gain pregnancy :23296::"25-35 pounds "} ?Prenatal labs reviewed, notable for ***. ?Indications  for referral to [facility] were reviewed and the patient {DOES NOT does:27190::"does not"} meet criteria for referral.  ?Medication list reviewed and updated.  ?Recommended patient see a dentist for regular care.  ?Bleeding and pain precautions reviewed. ?Importance of prenatal vitamins reviewed.  ?Genetic screening offered. Patient opted for: {obgeneticscreen:23414}. ?The patient has the following indications for aspirinto begin 81 mg at 12-16 weeks: ?One high risk condition: {fmcaspirinobhigh:26167} ?MORE than one moderate risk condition: {fmcaspirinobmoderate:26168} ?Aspirin {WAS/WAS NOT:2100118327::"was not"}    recommended today based upon above risk factors (one high risk condition or more than one moderate risk factor)  The patient {will/will not be:23415} age 53 or over at time of delivery. Referral to genetic counseling {WAS/WAS NOT:7032068475::"was not"} offered today.  The patient has the following risk factors for preexisting diabetes: {Pre-existing diabetes screening:23343::"Reviewed indications for early 1 hour glucose testing, not indicated "}. An early 1 hour glucose tolerance test {WAS/WAS NOT:7032068475::"was not"} ordered. Pregnancy Medical Home and PHQ-9 forms completed, problems noted: {yes/no:20286}  2. Pregnancy issues include the following which were addressed today:  ***   Follow up 4 weeks for next prenatal visit.

## 2021-09-21 NOTE — Telephone Encounter (Signed)
Patient called to reschedule appt, call routed to me.  Spoke with patient, she has had unreliable transportation until now.  Reports that she has a car now.  We discussed how no-showing appts takes away other appts from patients.  Pt is apologetic and expresses understanding.  We have scheduled once more, but patient is aware if she misses this appt she will need to find prenatal care at another practice.  Pt is aware and agreeable to plan.  Jone Baseman, CMA

## 2021-09-22 NOTE — Progress Notes (Unsigned)
?Patient Name: Kathleen Morrow ?Date of Birth: 05/14/1995 ?[facility] Initial Prenatal Visit ? ?Kathleen Morrow is a 25 y.o. year old G4P1031 at Unknown who presents for her initial prenatal visit. ?Pregnancy {Is/is not:9024} planned ?She reports {pregnancy symptoms:18128}. ?She {is/is not:320031::"is"} taking a prenatal vitamin.  ?She denies pelvic pain or vaginal bleeding.  ? ?Pregnancy Dating: ?The patient is dated by ***.  ?LMP: *** ?Period is certain:  {yes/no:20286}.  ?Periods were regular:  {yes/no:20286}.  ?LMP was a typical period:  {yes/no:20286}.  ?Using hormonal contraception in 3 months prior to conception: {yes/no:20286} ? ?Lab Review: ?Blood type: O POS ?Rh Status: {Rh status :23298::"+"} ?Antibody screen: {NEGATIVE/POSITIVE FOR:19998::"Negative"} ?HIV: {NEGATIVE/POSITIVE FOR:19998::"Negative"} ?RPR: {NEGATIVE/APPRO/POSITIVE FOR:20006::"Negative"} ?Hemoglobin electrophoresis reviewed: {yes/no:20286::"Yes"} ?Results of OB urine culture are: {NEGATIVE/POSITIVE FOR:19998::"Negative"} ?Rubella: {Desc; immune/not/unknown:31571::"Immune"} ?Hep C Ab: {NEGATIVE/POSITIVE FOR:19998::"Negative"} ?Varicella status is {Desc; immune/not/unknown:31571::"Immune"} ? ?PMH: Reviewed and as detailed below: ?HTN: {yes/no:20286::"No"}  ?Gestational Hypertension/preeclampsia: {yes/no:20286::"No"}  ?Type 1 or 2 Diabetes: {yes/no:20286::"No"}  ?Depression:  {yes/no:20286::"No"}  ?Seizure disorder:  {yes/no:20286::"No"} ?VTE: {yes/no:20286::"No"} ,  ?History of STI {yes/no:20286::"No"},  ?Abnormal Pap smear:  {yes/no:20286::"No"}, ?Genital herpes simplex:  {yes/no:20286::"No"}  ? ?PSH: ?Gynecologic Surgery:  {No/  **:31982:o:"no"} ?Surgical history reviewed, notable for: *** ? ?Obstetric History: ?Obstetric history tab updated and reviewed.  ?Summary of prior pregnancies: *** ?Cesarean delivery: {yes/no:20286::"No"}  ?Gestational Diabetes:  {yes/no:20286::"No"} ?Hypertension in pregnancy:  {yes/no:20286::"No"} ?History of preterm birth: {yes/no:20286::"No"} ?History of LGA/SGA infant:  {yes/no:20286::"No"} ?History of shoulder dystocia: {yes/no:20286::"No"} ?Indications for referral were reviewed, and the patient has no obstetric indications for referral to High Risk OB Clinic at this time.  ? ?Social History: ?Partner's name: ***  ?Tobacco use: {yes/no:20286::"No"} ?Alcohol use:  {yes/no:20286::"No"} ?Other substance use:  {yes/no:20286::"No"} ? ?Current Medications:  ?***  ?Reviewed and appropriate in pregnancy.  ? ?Genetic and Infection Screen: ?Flow Sheet Updated {yes/no:20286::"Yes"} ? ?Prenatal Exam: ?Gen: Well nourished, well developed.  No distress.  Vitals noted. ?HEENT: Normocephalic, atraumatic.  Neck supple without cervical lymphadenopathy, thyromegaly or thyroid nodules.  Fair dentition. ?CV: RRR no murmur, gallops or rubs ?Lungs: CTA B.  Normal respiratory effort without wheezes or rales. ?Abd: soft, NTND. +BS.  Uterus not appreciated above pelvis. ?GU: Normal external female genitalia without lesions.  Nl vaginal, well rugated without lesions. No vaginal discharge.  Bimanual exam: No adnexal mass or TTP. No CMT.  Uterus size *** ?Ext: No clubbing, cyanosis or edema. ?Psych: Normal grooming and dress.  Not depressed or anxious appearing.  Normal thought content and process without flight of ideas or looseness of associations ? ?Fetal heart tones: {appropriate:23337::"Appropriate"} ? ?Assessment/Plan: ? ?Kathleen Morrow is a 25 y.o. G4P1031 at Unknown who presents to initiate prenatal care. She is doing well.  ?Current pregnancy issues include ***. ? ?Routine prenatal care: ?As dating {ACTION; IS/IS NOT:21021397::"is not"} reliable, a dating ultrasound {HAS HAS NOT:18834::"has"} been ordered. Dating tab updated. ?Pre-pregnancy weight updated. Expected weight gain this pregnancy is {weight gain pregnancy :23296::"25-35 pounds "} ?Prenatal labs reviewed, notable for ***. ?Indications  for referral to [facility] were reviewed and the patient {DOES NOT does:27190::"does not"} meet criteria for referral.  ?Medication list reviewed and updated.  ?Recommended patient see a dentist for regular care.  ?Bleeding and pain precautions reviewed. ?Importance of prenatal vitamins reviewed.  ?Genetic screening offered. Patient opted for: {obgeneticscreen:23414}. ?The patient has the following indications for aspirinto begin 81 mg at 12-16 weeks: ?One high risk condition: {fmcaspirinobhigh:26167} ?MORE than one moderate risk condition: {fmcaspirinobmoderate:26168} ?Aspirin {WAS/WAS NOT:2100118327::"was not"}    recommended today based upon above risk factors (one high risk condition or more than one moderate risk factor)  The patient {will/will not be:23415} age 46 or over at time of delivery. Referral to genetic counseling {WAS/WAS [phone]::"was not"} offered today.  The patient has the following risk factors for preexisting diabetes: {Pre-existing diabetes screening:23343::"Reviewed indications for early 1 hour glucose testing, not indicated "}. An early 1 hour glucose tolerance test {WAS/WAS [phone]::"was not"} ordered. Pregnancy Medical Home and PHQ-9 forms completed, problems noted: {yes/no:20286}  2. Pregnancy issues include the following which were addressed today:  ***   Follow up 4 weeks for next prenatal visit.

## 2021-09-23 ENCOUNTER — Other Ambulatory Visit: Payer: Self-pay

## 2021-09-23 ENCOUNTER — Ambulatory Visit (INDEPENDENT_AMBULATORY_CARE_PROVIDER_SITE_OTHER): Payer: Medicaid Other | Admitting: Student

## 2021-09-23 ENCOUNTER — Encounter: Payer: Self-pay | Admitting: Student

## 2021-09-23 VITALS — BP 108/66 | HR 85 | Wt 118.4 lb

## 2021-09-23 DIAGNOSIS — R011 Cardiac murmur, unspecified: Secondary | ICD-10-CM

## 2021-09-23 DIAGNOSIS — Z3490 Encounter for supervision of normal pregnancy, unspecified, unspecified trimester: Secondary | ICD-10-CM

## 2021-09-23 MED ORDER — PRENATAL VITAMIN 27-0.8 MG PO TABS: 1.0000 | ORAL_TABLET | Freq: Every day | ORAL | 1 refills | Status: DC

## 2021-09-23 NOTE — Patient Instructions (Signed)
It was great to see you today! Thank you for choosing [facility] for your primary care. Kathleen Morrow was seen for initial OB.  Today we addressed: I have ordered initial OB labs and a dating ultrasound.  Please take your prenatal vitamin.  We will follow-up in 4 weeks for further care.  Call your OB Clinic or go to [facility] if: You begin to have strong, frequent contractions Your water breaks.  Sometimes it is a big gush of fluid, sometimes it is just a trickle that keeps getting your panties wet or running down your legs You have vaginal bleeding.  It is normal to have a small amount of spotting if your cervix was checked.  You don't feel your baby moving like normal.  If you don't, get you something to eat and drink and lay down and focus on feeling your baby move.  You should feel at least 10 movements in 2 hours.  If you don't, you should call the office or go to [facility]   Orders Placed This Encounter  Procedures   US Pelvic Complete With Transvaginal    Standing Status:   Future    Standing Expiration Date:   09/24/2022    Order Specific Question:   Reason for Exam (SYMPTOM  OR DIAGNOSIS REQUIRED)    Answer:   Dating    Order Specific Question:   Preferred imaging location?    Answer:   [facility] Ultrasound   CBC/D/Plt+RPR+Rh+ABO+RubIgG...   Meds ordered this encounter  Medications   Prenatal Vit-Fe Fumarate-FA (PRENATAL VITAMIN) 27-0.8 MG TABS    Sig: Take 1 tablet by mouth daily.    Dispense:  90 tablet    Refill:  1    If you haven't already, sign up for My Chart to have easy access to your labs results, and communication with your primary care physician.  We are checking some labs today. If they are abnormal, I will call you. If they are normal, I will send you a MyChart message (if it is active) or a letter in the mail. If you do not hear about your labs in the next 2 weeks, please call the office.   You should return to our clinic Return in  about 4 weeks (around 10/21/2021) for OB f/u.  I recommend that you always bring your medications to each appointment as this makes it easy to ensure you are on the correct medications and helps [place] not miss refills when you need them.  Please arrive 15 minutes before your appointment to ensure smooth check in process.  We appreciate your efforts in making this happen.  Please call the clinic at [phone] if your symptoms worsen or you have any concerns.  Thank you for allowing me to participate in your care, Kathleen Mattocks, DO 09/23/2021, 4:15 PM PGY-1, [facility]

## 2021-09-24 ENCOUNTER — Inpatient Hospital Stay: Admission: RE | Admit: 2021-09-24 | Payer: Medicaid Other | Source: Ambulatory Visit

## 2021-09-24 DIAGNOSIS — Z3490 Encounter for supervision of normal pregnancy, unspecified, unspecified trimester: Secondary | ICD-10-CM | POA: Diagnosis not present

## 2021-09-24 LAB — HEPATITIS C ANTIBODY: HCV Ab: NEGATIVE

## 2021-09-24 NOTE — Assessment & Plan Note (Addendum)
New OB labs ordered today.  Dating ultrasound ordered given inaccurate LMP.  OB office updated.  Family advised taking prenatal with iron, reordered prescription.  Follow-up in 4 weeks

## 2021-09-24 NOTE — Assessment & Plan Note (Addendum)
Suggestive of flow murmur, presents with Dr. Andria Frames.  Asymptomatic, monitor.

## 2021-09-25 LAB — RPR: RPR Ser Ql: NONREACTIVE

## 2021-09-25 LAB — CBC
Hematocrit: 35 % (ref 34.0–46.6)
Hemoglobin: 11.3 g/dL (ref 11.1–15.9)
MCH: 26.9 pg (ref 26.6–33.0)
MCHC: 32.3 g/dL (ref 31.5–35.7)
MCV: 83 fL (ref 79–97)
Platelets: 278 10*3/uL (ref 150–450)
RBC: 4.2 x10E6/uL (ref 3.77–5.28)
RDW: 12.8 % (ref 11.7–15.4)
WBC: 6.1 10*3/uL (ref 3.4–10.8)

## 2021-09-25 LAB — ABO AND RH: Rh Factor: POSITIVE

## 2021-09-25 LAB — HIV ANTIBODY (ROUTINE TESTING W REFLEX): HIV Screen 4th Generation wRfx: NONREACTIVE

## 2021-09-25 LAB — RUBELLA SCREEN: Rubella Antibodies, IGG: 6.62 index (ref >0.99)

## 2021-09-25 LAB — HEPATITIS B SURFACE ANTIGEN: Hepatitis B Surface Ag: NEGATIVE

## 2021-09-25 LAB — HEPATITIS C ANTIBODY: Hep C Virus Ab: NONREACTIVE

## 2021-09-26 ENCOUNTER — Encounter: Payer: Self-pay | Admitting: Student

## 2021-10-05 ENCOUNTER — Ambulatory Visit (HOSPITAL_BASED_OUTPATIENT_CLINIC_OR_DEPARTMENT_OTHER): Payer: Medicaid Other

## 2021-10-05 ENCOUNTER — Ambulatory Visit
Admission: RE | Admit: 2021-10-05 | Discharge: 2021-10-05 | Disposition: A | Discharge status: Home / Self Care | Payer: Medicaid Other | Source: Ambulatory Visit | Attending: Family Medicine | Admitting: Family Medicine

## 2021-10-05 ENCOUNTER — Other Ambulatory Visit: Payer: Self-pay | Admitting: Family Medicine

## 2021-10-05 DIAGNOSIS — Z3A17 17 weeks gestation of pregnancy: Secondary | ICD-10-CM | POA: Insufficient documentation

## 2021-10-05 DIAGNOSIS — Z3402 Encounter for supervision of normal first pregnancy, second trimester: Secondary | ICD-10-CM

## 2021-10-05 DIAGNOSIS — Z363 Encounter for antenatal screening for malformations: Secondary | ICD-10-CM | POA: Insufficient documentation

## 2021-10-05 DIAGNOSIS — O321XX0 Maternal care for breech presentation, not applicable or unspecified: Secondary | ICD-10-CM | POA: Diagnosis not present

## 2021-10-05 DIAGNOSIS — O0932 Supervision of pregnancy with insufficient antenatal care, second trimester: Secondary | ICD-10-CM | POA: Insufficient documentation

## 2021-10-05 DIAGNOSIS — Z3490 Encounter for supervision of normal pregnancy, unspecified, unspecified trimester: Secondary | ICD-10-CM | POA: Insufficient documentation

## 2021-10-06 ENCOUNTER — Telehealth: Payer: Self-pay | Admitting: Student

## 2021-10-06 ENCOUNTER — Other Ambulatory Visit: Payer: Self-pay | Admitting: *Deleted

## 2021-10-06 DIAGNOSIS — O0932 Supervision of pregnancy with insufficient antenatal care, second trimester: Secondary | ICD-10-CM

## 2021-10-06 NOTE — Telephone Encounter (Signed)
Discussed ultrasound and initial OB lab results with patient. Scheduled appointment on 6/15 w/ Dr. Ronnald Ramp for OB visit.

## 2021-10-15 ENCOUNTER — Other Ambulatory Visit: Payer: Self-pay

## 2021-10-15 ENCOUNTER — Ambulatory Visit (INDEPENDENT_AMBULATORY_CARE_PROVIDER_SITE_OTHER): Payer: Medicaid Other | Admitting: Student

## 2021-10-15 VITALS — BP 103/54 | HR 80 | Wt 123.4 lb

## 2021-10-15 DIAGNOSIS — Z31430 Encounter of female for testing for genetic disease carrier status for procreative management: Secondary | ICD-10-CM | POA: Diagnosis not present

## 2021-10-15 DIAGNOSIS — Z3490 Encounter for supervision of normal pregnancy, unspecified, unspecified trimester: Secondary | ICD-10-CM | POA: Diagnosis not present

## 2021-10-15 DIAGNOSIS — Z3482 Encounter for supervision of other normal pregnancy, second trimester: Secondary | ICD-10-CM | POA: Diagnosis not present

## 2021-10-15 NOTE — Patient Instructions (Signed)
-Start taking baby aspirin 81 mg daily to prevent pre eclampsia  -Return in 4 weeks  Second Trimester of Pregnancy  The second trimester of pregnancy is from week 13 through week 27. This is months 4 through 6 of pregnancy. The second trimester is often a time when you feel your best. Your body has adjusted to being pregnant, and you begin to feel better physically. During the second trimester: Morning sickness has lessened or stopped completely. You may have more energy. You may have an increase in appetite. The second trimester is also a time when the unborn baby (fetus) is growing rapidly. At the end of the sixth month, the fetus may be up to 12 inches long and weigh about 1 pounds. You will likely begin to feel the baby move (quickening) between 16 and 20 weeks of pregnancy. Body changes during your second trimester Your body continues to go through many changes during your second trimester. The changes vary and generally return to normal after the baby is born. Physical changes Your weight will continue to increase. You will notice your lower abdomen bulging out. You may begin to get stretch marks on your hips, abdomen, and breasts. Your breasts will continue to grow and to become tender. Dark spots or blotches (chloasma or mask of pregnancy) may develop on your face. A dark line from your belly button to the pubic area (linea nigra) may appear. You may have changes in your hair. These can include thickening of your hair, rapid growth, and changes in texture. Some people also have hair loss during or after pregnancy, or hair that feels dry or thin. Health changes You may develop headaches. You may have heartburn. You may develop constipation. You may develop hemorrhoids or swollen, bulging veins (varicose veins). Your gums may bleed and may be sensitive to brushing and flossing. You may urinate more often because the fetus is pressing on your bladder. You may have back pain. This is  caused by: Weight gain. Pregnancy hormones that are relaxing the joints in your pelvis. A shift in weight and the muscles that support your balance. Follow these instructions at home: Medicines Follow your health care provider's instructions regarding medicine use. Specific medicines may be either safe or unsafe to take during pregnancy. Do not take any medicines unless approved by your health care provider. Take a prenatal vitamin that contains at least 600 micrograms (mcg) of folic acid. Eating and drinking Eat a healthy diet that includes fresh fruits and vegetables, whole grains, good sources of protein such as meat, eggs, or tofu, and low-fat dairy products. Avoid raw meat and unpasteurized juice, milk, and cheese. These carry germs that can harm you and your baby. You may need to take these actions to prevent or treat constipation: Drink enough fluid to keep your urine pale yellow. Eat foods that are high in fiber, such as beans, whole grains, and fresh fruits and vegetables. Limit foods that are high in fat and processed sugars, such as fried or sweet foods. Activity Exercise only as directed by your health care provider. Most people can continue their usual exercise routine during pregnancy. Try to exercise for 30 minutes at least 5 days a week. Stop exercising if you develop contractions in your uterus. Stop exercising if you develop pain or cramping in the lower abdomen or lower back. Avoid exercising if it is very hot or humid or if you are at a high altitude. Avoid heavy lifting. If you choose to, you may have  sex unless your health care provider tells you not to. Relieving pain and discomfort Wear a supportive bra to prevent discomfort from breast tenderness. Take warm sitz baths to soothe any pain or discomfort caused by hemorrhoids. Use hemorrhoid cream if your health care provider approves. Rest with your legs raised (elevated) if you have leg cramps or low back pain. If you  develop varicose veins: Wear support hose as told by your health care provider. Elevate your feet for 15 minutes, 3-4 times a day. Limit salt in your diet. Safety Wear your seat belt at all times when driving or riding in a car. Talk with your health care provider if someone is verbally or physically abusive to you. Lifestyle Do not use hot tubs, steam rooms, or saunas. Do not douche. Do not use tampons or scented sanitary pads. Avoid cat litter boxes and soil used by cats. These carry germs that can cause birth defects in the baby and possibly loss of the fetus by miscarriage or stillbirth. Do not use herbal remedies, alcohol, illegal drugs, or medicines that are not approved by your health care provider. Chemicals in these products can harm your baby. Do not use any products that contain nicotine or tobacco, such as cigarettes, e-cigarettes, and chewing tobacco. If you need help quitting, ask your health care provider. General instructions During a routine prenatal visit, your health care provider will do a physical exam and other tests. He or she will also discuss your overall health. Keep all follow-up visits. This is important. Ask your health care provider for a referral to a local prenatal education class. Ask for help if you have counseling or nutritional needs during pregnancy. Your health care provider can offer advice or refer you to specialists for help with various needs. Where to find more information American Pregnancy Association: americanpregnancy.org [employer]: https://www.todd-brady.net/ Office on [employer] Health: MightyReward.co.nz Contact a health care provider if you have: A headache that does not go away when you take medicine. Vision changes or you see spots in front of your eyes. Mild pelvic cramps, pelvic pressure, or nagging pain in the abdominal area. Persistent nausea, vomiting, or diarrhea. A  bad-smelling vaginal discharge or foul-smelling urine. Pain when you urinate. Sudden or extreme swelling of your face, hands, ankles, feet, or legs. A fever. Get help right away if you: Have fluid leaking from your vagina. Have spotting or bleeding from your vagina. Have severe abdominal cramping or pain. Have difficulty breathing. Have chest pain. Have fainting spells. Have not felt your baby move for the time period told by your health care provider. Have new or increased pain, swelling, or redness in an arm or leg. Summary The second trimester of pregnancy is from week 13 through week 27 (months 4 through 6). Do not use herbal remedies, alcohol, illegal drugs, or medicines that are not approved by your health care provider. Chemicals in these products can harm your baby. Exercise only as directed by your health care provider. Most people can continue their usual exercise routine during pregnancy. Keep all follow-up visits. This is important. This information is not intended to replace advice given to you by your health care provider. Make sure you discuss any questions you have with your health care provider. Document Revised: 09/26/2019 Document Reviewed: 08/02/2019 Elsevier Patient Education  2023 [employer].

## 2021-10-15 NOTE — Progress Notes (Signed)
  Patient Name: Kathleen Morrow Date of Birth: November 12, 1995 [facility] Prenatal Visit  Kathleen Morrow is a 26 y.o. [phone] at [redacted]w[redacted]d here for routine follow up. She is dated by midtrimester ultrasound.  She reports fatigue, headache, and nausea.  She denies vaginal bleeding, LOF, and contractions.  Was previously taking PNV but ran out and hasn't had transportation to get more for past week, states she will get them soon. See flow sheet for details.  Vitals:   10/15/21 1450  BP: (!) 103/54  Pulse: 80     A/P: Pregnancy at [redacted]w[redacted]d.  Doing well.    Routine Prenatal Care:  Dating reviewed, dating tab is correct Fetal heart tones Appropriate Influenza vaccine not administered as not influenza season.   COVID vaccination was discussed and declined.  The patient has the following indication for screening preexisting diabetes: Reviewed indications for early 1 hour glucose testing, not indicated . Anatomy ultrasound ordered to be scheduled at 18-20 weeks. Patient is interested in genetic screening. Panorama, horizon and AFP ordered today Pregnancy education including expected weight gain in pregnancy, OTC medication use, continued use of prenatal vitamin, smoking cessation if applicable, and nutrition in pregnancy.   Bleeding and pain precautions reviewed. The patient has the following indications for aspirinto begin 81 mg at 12-16 weeks: One high risk condition: no single high risk condition  MORE than one moderate risk condition: low SES   and identifies as African American  Aspirin was  recommended today based upon above risk factors (one high risk condition or more than one moderate risk factor)   2. Pregnancy issues include the following and were addressed as appropriate today:  Start bASA today Problem list  and pregnancy box updated: Yes.   Follow up 4 weeks.

## 2021-10-17 LAB — AFP, SERUM, OPEN SPINA BIFIDA
AFP MoM: 1.01
AFP Value: 65.8 ng/mL
Gest. Age on Collection Date: 19.1 weeks
Maternal Age At EDD: 23.9 yr
OSBR Risk 1 IN: 10000
Test Results:: NEGATIVE
Weight: 123 [lb_av]

## 2021-10-28 ENCOUNTER — Telehealth: Payer: Self-pay | Admitting: Student

## 2021-10-28 NOTE — Telephone Encounter (Signed)
Attempted to reach patient about prenatal screening results.  Left message.  Will attempt at a later time.

## 2021-10-29 ENCOUNTER — Ambulatory Visit: Payer: Medicaid Other

## 2021-10-29 ENCOUNTER — Telehealth: Payer: Self-pay | Admitting: Student

## 2021-10-29 ENCOUNTER — Ambulatory Visit: Payer: Medicaid Other | Attending: Family Medicine

## 2021-10-29 NOTE — Telephone Encounter (Addendum)
Attempted to call again to discuss results of prenatal screening.  Left voice message to call clinic back.  Would like to know if she prefers to know gender of baby.  Prenatal screening negative.

## 2021-10-30 ENCOUNTER — Telehealth: Payer: Self-pay | Admitting: Student

## 2021-10-30 NOTE — Telephone Encounter (Signed)
Unable to reach about prenatal screening results. Left VM to discuss if desired.

## 2021-11-02 IMAGING — US US MFM OB FOLLOW-UP
1 series · 14 of 28 positions shown · non-contrast
Comparison: none

[Series 1: us mfm ob follow-up · 28 acquisitions, 14 frames shown]
[im 2/28]
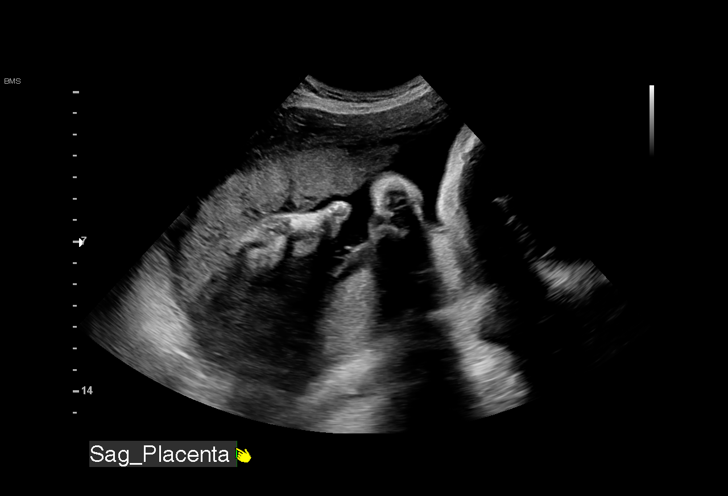
[im 4/28]
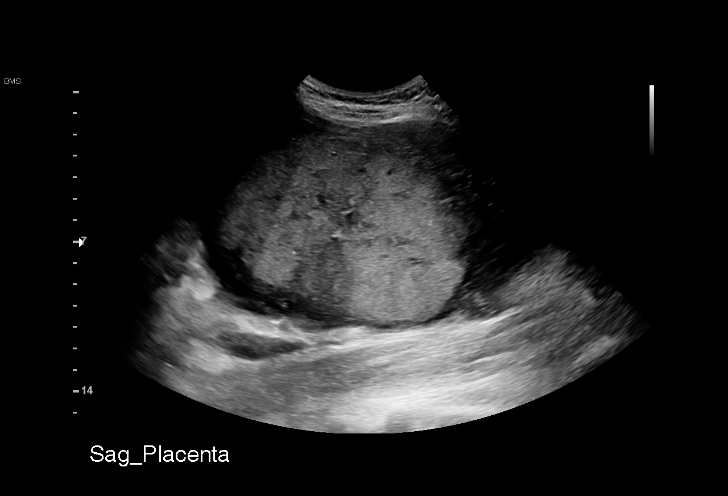
[im 6/28]
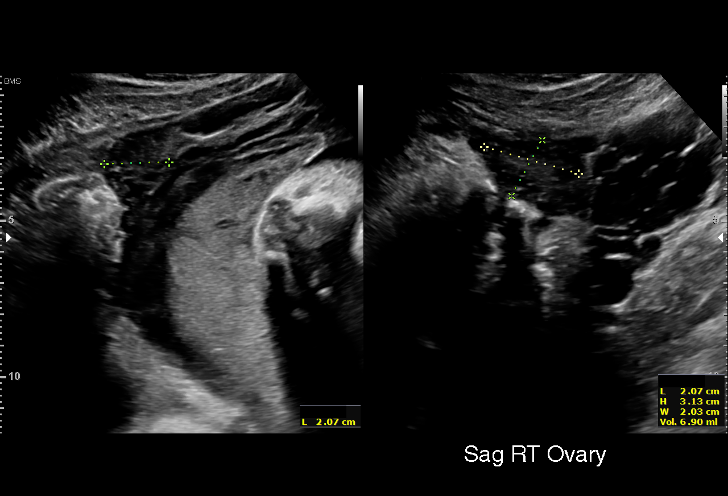
[im 8/28]
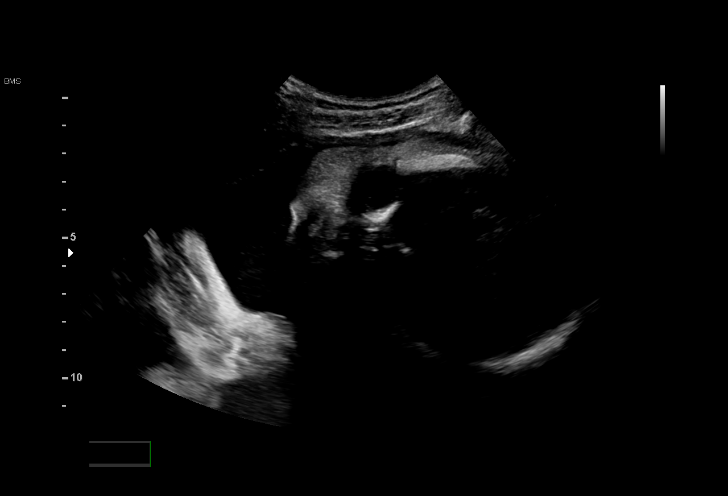
[im 10/28]
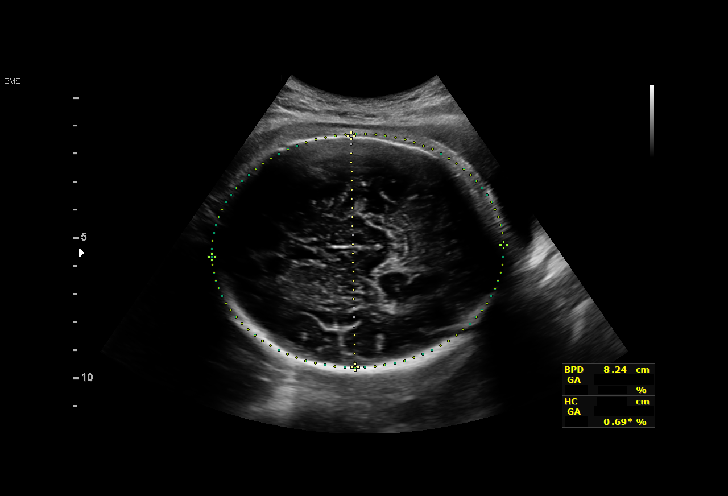
[im 12/28]
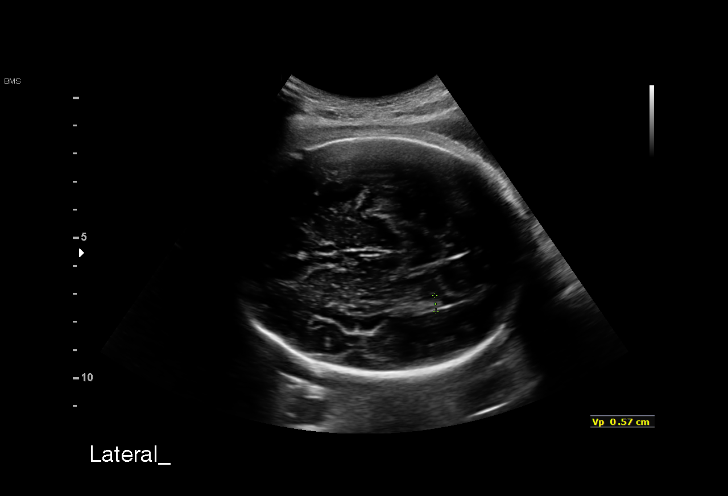
[im 14/28]
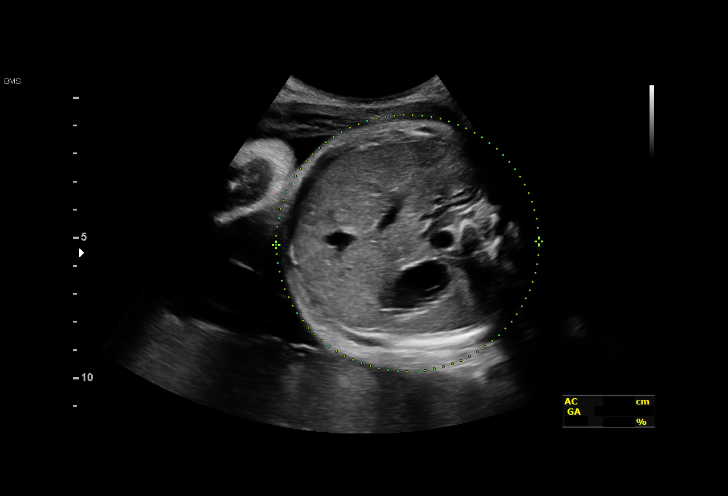
[im 16/28]
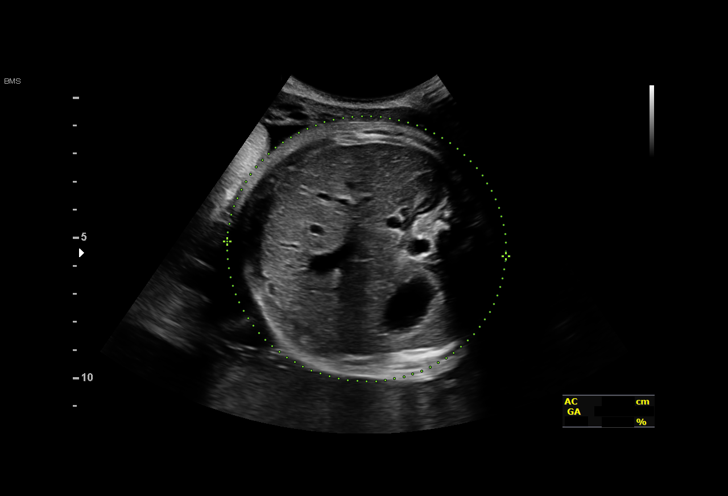
[im 18/28]
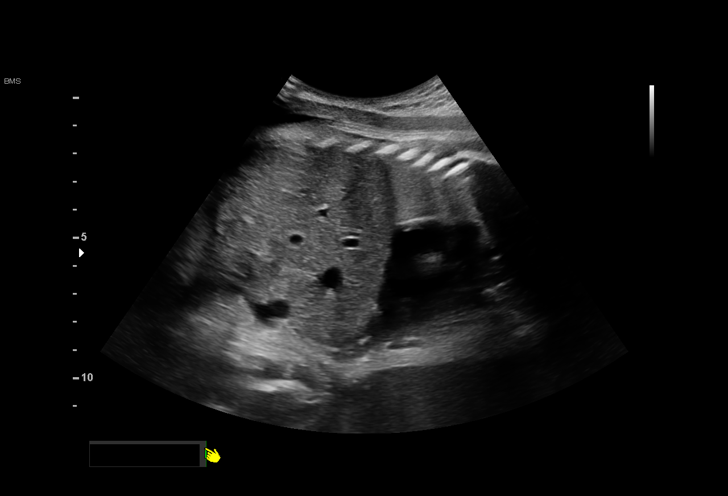
[im 20/28]
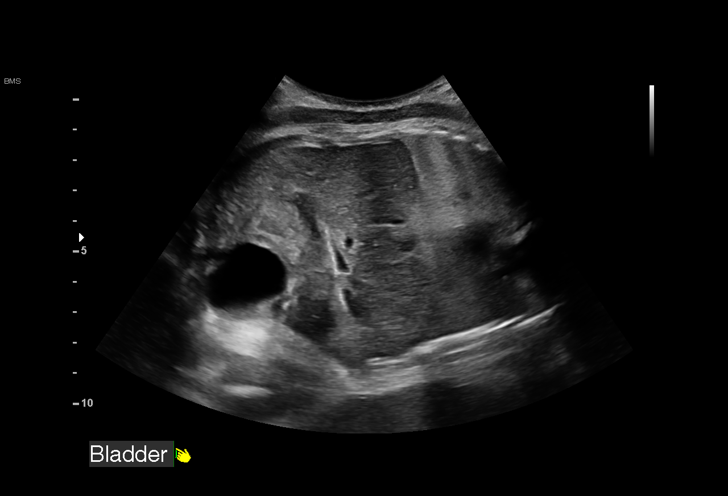
[im 22/28]
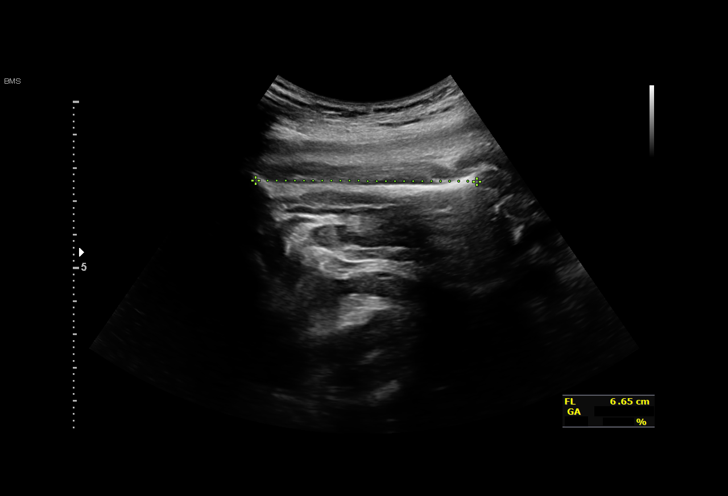
[im 24/28]
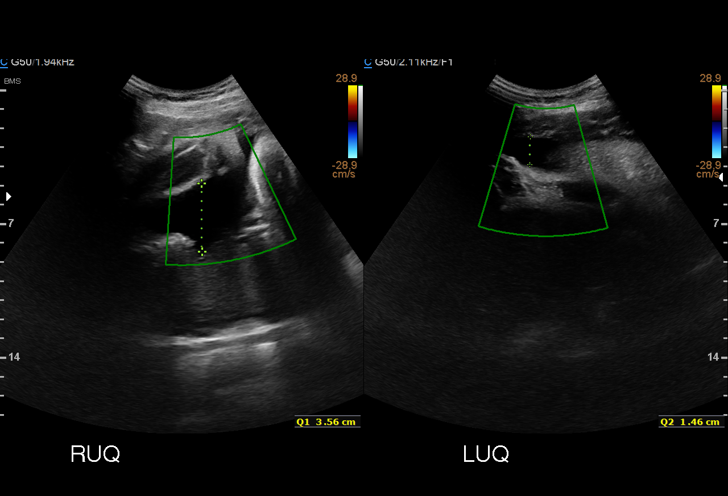
[im 26/28]
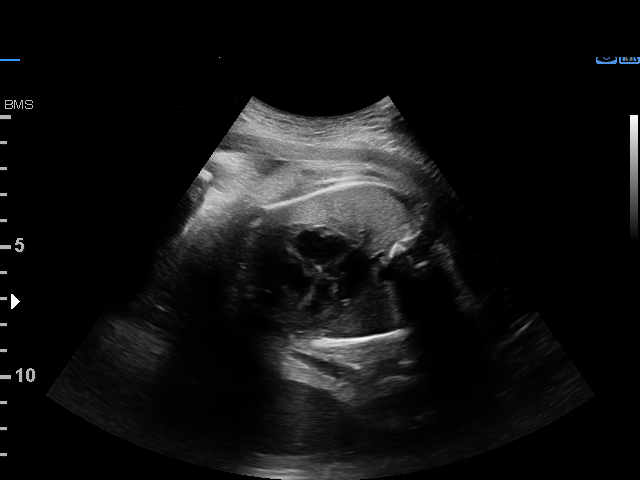
[im 28/28]
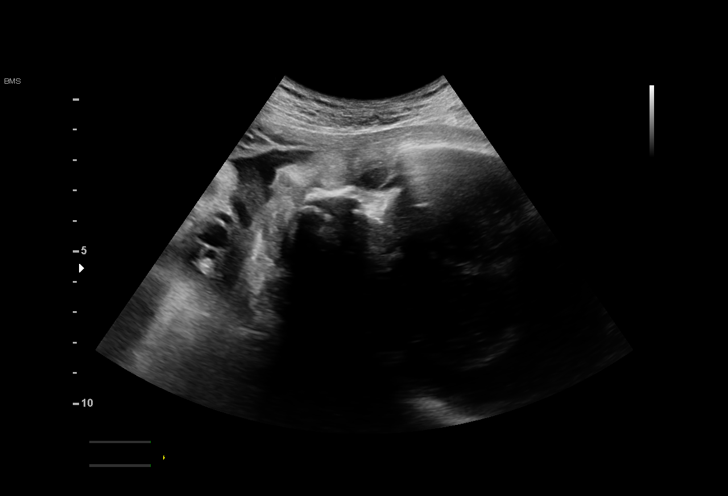

[14 of 28 positions shown; findings below may reference images not displayed]

Name:       JOHANN CAZARES             Visit Date: 10/25/2019 [DATE]


Indications

 Substance abuse affecting pregnancy,
 antepartum
 33 weeks gestation of pregnancy
 Encounter for antenatal screening for
 malformations
 Asthma                                         LFF.4F [id number]
Vital Signs

                                                Height:        5'0"
Fetal Evaluation

 Num Of Fetuses:         1
 Fetal Heart Rate(bpm):  129
 Cardiac Activity:       Observed
 Presentation:           Cephalic
 Placenta:               Posterior
 P. Cord Insertion:      Previously Visualized

 Amniotic Fluid
 AFI FV:      Within normal limits

 AFI Sum(cm)     %Tile       Largest Pocket(cm)
 13.1            42

 RUQ(cm)       RLQ(cm)       LUQ(cm)        LLQ(cm)

Biometry

 BPD:      81.7  mm     G. Age:  32w 6d         19  %    CI:        76.47   %    70 - 86
                                                         FL/HC:      22.4   %    19.4 -
 HC:       296   mm     G. Age:  32w 5d        3.2  %    HC/AC:      0.99        0.96 -
 AC:       300   mm     G. Age:  34w 0d         57  %    FL/BPD:     81.0   %    71 - 87
 FL:       66.2  mm     G. Age:  34w 1d         46  %    FL/AC:      22.1   %    20 - 24
 LV:        5.7  mm
 Est. FW:    6687  gm           5 lb     40  %
OB History

 Gravidity:    4         Term:   0        Prem:   0        SAB:   0
 TOP:          3       Ectopic:  0        Living: 0
Gestational Age

 LMP:           34w 3d        Date:  02/26/19                 EDD:   12/03/19
 U/S Today:     33w 3d                                        EDD:   12/10/19
 Best:          33w 6d     Det. By:  Previous Ultrasound      EDD:   12/07/19
                                     (04/18/19)
Anatomy

 Cranium:               Previously seen        LVOT:                   Previously seen
 Cavum:                 Previously seen        Aortic Arch:            Not well visualized
 Ventricles:            Appears normal         Ductal Arch:            Previously seen
 Choroid Plexus:        Previously seen        Diaphragm:              Appears normal
 Cerebellum:            Previously seen        Stomach:                Appears normal, left
                                                                       sided
 Posterior Fossa:       Previously seen        Abdomen:                Appears normal
 Nuchal Fold:           Previously seen        Abdominal Wall:         Previously seen
 Face:                  Orbits and profile     Cord Vessels:           Previously seen
                        previously seen
 Lips:                  Previously seen        Kidneys:                Appear normal
 Palate:                Not well visualized    Bladder:                Appears normal
 Thoracic:              Appears normal         Spine:                  Previously seen
 Heart:                 Appears normal         Upper Extremities:      Previously seen
                        (4CH, axis, and
                        situs)
 RVOT:                  Previously seen        Lower Extremities:      Previously seen

 Other:  Heels and 5th digit prev visualized.
Cervix Uterus Adnexa

 Cervix
 Not visualized (advanced GA >85wks)

 Uterus
 No abnormality visualized.

 Right Ovary
 Within normal limits.

 Left Ovary
 Within normal limits.

 Cul De Sac
 No free fluid seen.

 Adnexa
 No adnexal mass visualized.
Comments

 This patient was seen for a follow up growth scan due to
 maternal marijuana use.  She denies any problems since her
 last exam.
 She was informed that the fetal growth and amniotic fluid
 level appears appropriate for her gestational age.
 No further exams were scheduled in our office.

## 2021-11-05 ENCOUNTER — Encounter: Payer: Self-pay | Admitting: Family Medicine

## 2021-12-18 ENCOUNTER — Encounter: Payer: Medicaid Other | Admitting: Student

## 2021-12-22 ENCOUNTER — Ambulatory Visit: Payer: Medicaid Other

## 2021-12-24 ENCOUNTER — Ambulatory Visit: Payer: Medicaid Other | Attending: Obstetrics | Admitting: *Deleted

## 2021-12-24 ENCOUNTER — Ambulatory Visit (HOSPITAL_BASED_OUTPATIENT_CLINIC_OR_DEPARTMENT_OTHER): Payer: Medicaid Other

## 2021-12-24 VITALS — BP 103/58 | HR 73

## 2021-12-24 DIAGNOSIS — R011 Cardiac murmur, unspecified: Secondary | ICD-10-CM | POA: Insufficient documentation

## 2021-12-24 DIAGNOSIS — Z363 Encounter for antenatal screening for malformations: Secondary | ICD-10-CM | POA: Diagnosis not present

## 2021-12-24 DIAGNOSIS — Z3A29 29 weeks gestation of pregnancy: Secondary | ICD-10-CM | POA: Diagnosis not present

## 2021-12-24 DIAGNOSIS — O99513 Diseases of the respiratory system complicating pregnancy, third trimester: Secondary | ICD-10-CM | POA: Diagnosis not present

## 2021-12-24 DIAGNOSIS — O0932 Supervision of pregnancy with insufficient antenatal care, second trimester: Secondary | ICD-10-CM

## 2021-12-24 DIAGNOSIS — O0933 Supervision of pregnancy with insufficient antenatal care, third trimester: Secondary | ICD-10-CM | POA: Insufficient documentation

## 2021-12-28 ENCOUNTER — Encounter: Payer: Medicaid Other | Admitting: Family Medicine

## 2022-01-11 ENCOUNTER — Inpatient Hospital Stay (HOSPITAL_COMMUNITY)
Admission: AD | Admit: 2022-01-11 | Discharge: 2022-01-11 | Disposition: A | Discharge status: Home / Self Care | Payer: Medicaid Other | Attending: Obstetrics & Gynecology | Admitting: Obstetrics & Gynecology

## 2022-01-11 ENCOUNTER — Encounter (HOSPITAL_COMMUNITY): Payer: Self-pay | Admitting: Obstetrics & Gynecology

## 2022-01-11 ENCOUNTER — Other Ambulatory Visit: Payer: Self-pay

## 2022-01-11 DIAGNOSIS — Z3A31 31 weeks gestation of pregnancy: Secondary | ICD-10-CM | POA: Insufficient documentation

## 2022-01-11 DIAGNOSIS — O212 Late vomiting of pregnancy: Secondary | ICD-10-CM | POA: Insufficient documentation

## 2022-01-11 DIAGNOSIS — A059 Bacterial foodborne intoxication, unspecified: Secondary | ICD-10-CM | POA: Diagnosis not present

## 2022-01-11 DIAGNOSIS — O4703 False labor before 37 completed weeks of gestation, third trimester: Secondary | ICD-10-CM | POA: Insufficient documentation

## 2022-01-11 DIAGNOSIS — O26893 Other specified pregnancy related conditions, third trimester: Secondary | ICD-10-CM | POA: Diagnosis not present

## 2022-01-11 LAB — URINALYSIS, ROUTINE W REFLEX MICROSCOPIC
Bacteria, UA: NONE SEEN
Bilirubin Urine: NEGATIVE
Glucose, UA: NEGATIVE mg/dL
Hgb urine dipstick: NEGATIVE
Ketones, ur: 80 mg/dL — AB
Leukocytes,Ua: NEGATIVE
Nitrite: NEGATIVE
Protein, ur: 30 mg/dL — AB
Specific Gravity, Urine: 1.016 (ref 1.005–1.030)
pH: 7 (ref 5.0–8.0)

## 2022-01-11 LAB — POCT FERN TEST: POCT Fern Test: NEGATIVE

## 2022-01-11 MED ORDER — LACTATED RINGERS IV BOLUS
1000.0000 mL | Freq: Once | INTRAVENOUS | Status: AC
  Administered 2022-01-11: 1000 mL via INTRAVENOUS

## 2022-01-11 MED ORDER — ACETAMINOPHEN 500 MG PO TABS
1000.0000 mg | ORAL_TABLET | Freq: Once | ORAL | Status: AC
Start: 2022-01-11 — End: 2022-01-11
  Administered 2022-01-11: 1000 mg via ORAL
  Filled 2022-01-11: qty 2

## 2022-01-11 MED ORDER — TERBUTALINE SULFATE 1 MG/ML IJ SOLN
0.2500 mg | Freq: Once | INTRAMUSCULAR | Status: AC
Start: 2022-01-11 — End: 2022-01-11
  Administered 2022-01-11: 0.25 mg via SUBCUTANEOUS
  Filled 2022-01-11: qty 1

## 2022-01-11 NOTE — Discharge Instructions (Signed)

## 2022-01-11 NOTE — MAU Note (Signed)
E signature not working. AVS reviewed with pt. And opportunity for questions allowed. Pt. Voices understanding. Return precautions reviewed. Paper AVS signed by pt. And RN.

## 2022-01-11 NOTE — MAU Note (Addendum)
.  Kathleen Morrow is a 26 y.o. at [redacted]w[redacted]d here in MAU reporting: painful ctxs for last 15 min q. 2 min. 8/10 pain. Describes as sharp cramping. Pt. States she ate some strawberries at home and then had an emesis episode followed by intense abdominal pain. Endorses positive FM. States she had a gush of clear fluid at 1530. No VB or HA.  LMP: unsure  Onset of complaint: 1545 Pain score: 8/10 Vitals:   01/11/22 1609 01/11/22 1613  BP:  (!) 104/58  Pulse:  88  Resp: 18   Temp: 97.7 F (36.5 C)      FHT:140s Lab orders placed from triage: UA

## 2022-01-11 NOTE — MAU Provider Note (Signed)
History     CSN: [id number]  Arrival date and time: 01/11/22 1601   Event Date/Time   First Provider Initiated Contact with Patient 01/11/22 1612      Chief Complaint  Patient presents with   Contractions   HPI  Kathleen Morrow is a 26 y.o. [id number] at [redacted]w[redacted]d who presents for evaluation of abdominal pain. Patient presented to MAU screaming and crying. She reports approximately 15 minutes ago, she started having abdominal pain after eating a chocolate strawberry snack from tiktok. She states she ate it, immediately vomited violently and started having all over abdominal pain.   Staff assisted patient to the bed and after lying down, patient reports pain is much improved and is no longer crying. Patient rates the pain as a 8/10 and has not tried anything for the pain. She states she is no longer nauseous.   She denies any vaginal bleeding, discharge, and leaking of fluid. Denies any constipation, diarrhea or any urinary complaints. Reports normal fetal movement.   OB History     Gravida  5   Para  1   Term  1   Preterm  0   AB  3   Living  1      SAB      IAB      Ectopic      Multiple  0   Live Births  1           Past Medical History:  Diagnosis Date   Anemia    Asthma    BEHAVIOR PROBLEM 09/25/2008   Qualifier: Diagnosis of  By: Deirdre Peer MD, Erin     Encounter for vaginal delivery 12/15/2019   Prenatal care in third trimester 10/23/2019    Past Surgical History:  Procedure Laterality Date   NO PAST SURGERIES      Family History  Problem Relation Age of Onset   Epilepsy Brother    Epilepsy Mother     Social History   Tobacco Use   Smoking status: Former   Smokeless tobacco: Never  Building services engineer Use: Never used  Substance Use Topics   Alcohol use: No   Drug use: No    Comment: THC positive during pregnancy and at admission    Allergies:  Allergies  Allergen Reactions   Shellfish Allergy Hives and Anaphylaxis    Facial  swelling unknonw Facial swelling    Bee Pollen    Pollen Extract     Medications Prior to Admission  Medication Sig Dispense Refill Last Dose   doxylamine, Sleep, (UNISOM) 25 MG tablet Take 25 mg by mouth at bedtime as needed. (Patient not taking: Reported on 12/24/2021)   More than a month   Prenatal Vit-Fe Fumarate-FA (PRENATAL VITAMIN) 27-0.8 MG TABS Take 1 tablet by mouth daily. 90 tablet 1 More than a month    Review of Systems  Constitutional: Negative.  Negative for fatigue and fever.  HENT: Negative.    Respiratory: Negative.  Negative for shortness of breath.   Cardiovascular: Negative.  Negative for chest pain.  Gastrointestinal:  Positive for abdominal pain, nausea and vomiting. Negative for constipation and diarrhea.  Genitourinary: Negative.  Negative for dysuria, vaginal bleeding and vaginal discharge.  Neurological: Negative.  Negative for dizziness and headaches.   Physical Exam   Blood pressure (!) 102/52, pulse 79, temperature 97.7 F (36.5 C), temperature source Oral, resp. rate 18, SpO2 97 %.  Patient Vitals for the past 24 hrs:  BP  Temp Temp src Pulse Resp SpO2  01/11/22 1656 (!) 102/52 -- -- 79 18 --  01/11/22 1630 -- -- -- -- -- 97 %  01/11/22 1613 (!) 104/58 -- -- 88 -- --  01/11/22 1609 -- 97.7 F (36.5 C) Oral -- 18 --    Physical Exam Vitals and nursing note reviewed.  Constitutional:      General: She is not in acute distress.    Appearance: She is well-developed.  HENT:     Head: Normocephalic.  Eyes:     Pupils: Pupils are equal, round, and reactive to light.  Cardiovascular:     Rate and Rhythm: Normal rate and regular rhythm.     Heart sounds: Normal heart sounds.  Pulmonary:     Effort: Pulmonary effort is normal. No respiratory distress.     Breath sounds: Normal breath sounds.  Abdominal:     General: Bowel sounds are normal. There is no distension.     Palpations: Abdomen is soft.     Tenderness: There is no abdominal  tenderness.  Skin:    General: Skin is warm and dry.  Neurological:     Mental Status: She is alert and oriented to person, place, and time.  Psychiatric:        Mood and Affect: Mood normal.        Behavior: Behavior normal.        Thought Content: Thought content normal.        Judgment: Judgment normal.     Fetal Tracing:  Baseline: 140 Variability: moderate Accels: 15x15 Decels: none  Toco:  Closed/thick/posterior  MAU Course  Procedures  Results for orders placed or performed during the hospital encounter of 01/11/22 (from the past 24 hour(s))  Fern Test     Status: None   Collection Time: 01/11/22  4:05 PM  Result Value Ref Range   POCT Fern Test Negative = intact amniotic membranes     MDM  Labs ordered and reviewed.   UA Patient has had no water today.  LR bolus Patient still contracting after 40 minutes and rating them 8/10. Inability to give procardia do to baseline hypotension. Discussed terbutaline and patient agreeable to plan of care.   Terbutaline  Patient reports relief from contractions. Cervix unchanged.   Assessment and Plan   1. Preterm uterine contractions in third trimester, antepartum   2. [redacted] weeks gestation of pregnancy   3. Food poisoning     -Discharge home in stable condition -Preterm labor precautions discussed -Patient advised to follow-up with OB as scheduled for prenatal care -Patient may return to MAU as needed or if her condition were to change or worsen  Rolm Bookbinder, CNM 01/11/2022, 5:48 PM

## 2022-01-11 NOTE — MAU Note (Addendum)
Pt brought back in wc. Pt in tears.  Sudden onset of Pain, started 15 min before she arrived, so bad she fell out of bed, had a gush of clear fluid. Vomiting.  Assisted into gown, abd very soft on palpation.  Denies any bleeding. CNM at bedside.

## 2022-01-28 ENCOUNTER — Other Ambulatory Visit: Payer: Self-pay

## 2022-01-28 ENCOUNTER — Ambulatory Visit (INDEPENDENT_AMBULATORY_CARE_PROVIDER_SITE_OTHER): Payer: Medicaid Other | Admitting: Family Medicine

## 2022-01-28 VITALS — BP 107/71 | HR 99 | Wt 139.4 lb

## 2022-01-28 DIAGNOSIS — R12 Heartburn: Secondary | ICD-10-CM | POA: Diagnosis not present

## 2022-01-28 DIAGNOSIS — Z3A34 34 weeks gestation of pregnancy: Secondary | ICD-10-CM

## 2022-01-28 DIAGNOSIS — Z23 Encounter for immunization: Secondary | ICD-10-CM

## 2022-01-28 DIAGNOSIS — Z3493 Encounter for supervision of normal pregnancy, unspecified, third trimester: Secondary | ICD-10-CM | POA: Diagnosis not present

## 2022-01-28 LAB — POCT 1 HR PRENATAL GLUCOSE: Glucose 1 Hr Prenatal, POC: 132 mg/dL

## 2022-01-28 MED ORDER — TETANUS-DIPHTH-ACELL PERTUSSIS 5-2.5-18.5 LF-MCG/0.5 IM SUSY: 0.5000 mL | PREFILLED_SYRINGE | Freq: Once | INTRAMUSCULAR | 0 refills | Status: AC

## 2022-01-28 MED ORDER — PANTOPRAZOLE SODIUM 40 MG PO TBEC: 40.0000 mg | DELAYED_RELEASE_TABLET | Freq: Every day | ORAL | 3 refills | Status: DC

## 2022-01-28 NOTE — Progress Notes (Signed)
  [place] Prenatal Visit  Kathleen Morrow is a 26 y.o. [phone] at [redacted]w[redacted]d here for routine follow up. She is dated by midtrimester ultrasound.  She reports heartburn, no bleeding, no contractions, no cramping, and no leaking. Had a recent MAU visit for ctx and received terbutaline. Feeing well since. Heartburn is bothersome to her. She reports fetal movement. She denies vaginal bleeding, contractions, or loss of fluid.  See flow sheet for details.  There were no vitals filed for this visit.   A/P: Pregnancy at [redacted]w[redacted]d.  Doing well.   Routine prenatal care:  Dating reviewed, dating tab is correct Fetal heart tones: Appropriate Fundal height: within expected range.  The patient does not have a history of HSV and valacyclovir is not indicated at this time.  The patient does not have a history of Cesarean delivery and no referral to [facility] is indicated Infant feeding choice: Both  Contraception choice: Depo-Provera Infant circumcision desired yes Influenza vaccine administered today.   Tdap was not given today as clinic out of stock--written prescription given to be taken to pharmacy Childbirth and education classes were offered. Pregnancy education regarding benefits of breastfeeding, contraception, fetal growth, expected weight gain, and safe infant sleep were discussed.  Preterm labor and fetal movement precautions reviewed.   2. Pregnancy issues include the following and were addressed as appropriate today:  We are playing catch-up on a number of pregnancy-related issues here: Patient needs OB urine culture, Antibody screen, 28wk labs (HIV, CBC, RPR), 1 hr GTT.    Heartburn symptoms: Will treat with Protonix  Patient unaware of varicella status, will query [facility]--per [facility] review, did have varicella vaccine series.   Problem list and pregnancy box updated: Yes.    Follow up 2 weeks. Appointment made.   Pearla Dubonnet, MD

## 2022-01-28 NOTE — Patient Instructions (Addendum)
Kathleen Morrow, It is such a joy to meet you! I'm sorry you've been having some heartburn. I sent some acid blocking medicine in to your pharmacy for you. I am sending you with a prescription for your Tdap vaccine--please take this to your pharmacy ASAP to get your shot. We will see you back in 2 weeks!   Pearla Dubonnet, MD  Prenatal Classes Go to [email or web address] for more information on the pregnancy and child birth classes that [facility] has to offer.   Pregnancy Related Return Precautions The follow are signs/symptoms that are abnormal in pregnancy and may require further evaluation by a physician: Go to the MAU at [place] at [facility] if: You have cramping/contractions that do not go away with drinking water, especially if they are lasting 30 seconds to 1.5 minutes, coming and going every 5-10 minutes for an hour or more, or are getting stronger and you cannot walk or talk while having a contraction/cramp. Your water breaks.  Sometimes it is a big gush of fluid, sometimes it is just a trickle that keeps getting your underwear wet or running down your legs You have vaginal bleeding.    You do not feel your baby moving like normal.  If you do not, get something to eat and drink (something cold or something with sugar like peanut butter or juice) and lay down and focus on feeling your baby move. If your baby is still not moving like normal, you should go to MAU. You should feel your baby move 6 times in one hour, or 10 times in two hours. You have a persistent headache that does not go away with 1 g of Tylenol, vision changes, chest pain, difficulty breathing, severe pain in your right upper abdomen, worsening leg swelling- these can all be signs of high blood pressure in pregnancy and need to be evaluated by a provider immediately  These are all concerning in pregnancy and if you have any of these I recommend you call your PCP and present  to the Maternity Admissions Unit (map below) for further evaluation.  For any pregnancy-related emergencies, please go to the Maternity Admissions Unit in the [place] at [place] will use hospital Entrance C.    Our clinic number is [phone].   Dr Thompson Grayer

## 2022-01-29 LAB — CBC
Hematocrit: 28.1 % — ABNORMAL LOW (ref 34.0–46.6)
Hemoglobin: 8.5 g/dL — ABNORMAL LOW (ref 11.1–15.9)
MCH: 22.4 pg — ABNORMAL LOW (ref 26.6–33.0)
MCHC: 30.2 g/dL — ABNORMAL LOW (ref 31.5–35.7)
MCV: 74 fL — ABNORMAL LOW (ref 79–97)
Platelets: 273 10*3/uL (ref 150–450)
RBC: 3.79 x10E6/uL (ref 3.77–5.28)
RDW: 13.7 % (ref 11.7–15.4)
WBC: 5.8 10*3/uL (ref 3.4–10.8)

## 2022-01-29 LAB — RPR W/REFLEX TO TREPSURE: RPR: NONREACTIVE

## 2022-01-29 LAB — ANTIBODY SCREEN: Antibody Screen: NEGATIVE

## 2022-01-29 LAB — T PALLIDUM ANTIBODY, EIA: T pallidum Antibody, EIA: NEGATIVE

## 2022-01-29 LAB — HIV ANTIBODY (ROUTINE TESTING W REFLEX): HIV Screen 4th Generation wRfx: NONREACTIVE

## 2022-01-30 LAB — CULTURE, OB URINE

## 2022-01-30 LAB — URINE CULTURE, OB REFLEX

## 2022-02-02 ENCOUNTER — Telehealth: Payer: Self-pay | Admitting: Family Medicine

## 2022-02-02 MED ORDER — POLYETHYLENE GLYCOL 3350 17 GM/SCOOP PO POWD: 17.0000 g | Freq: Every day | ORAL | 0 refills | Status: DC | PRN

## 2022-02-02 MED ORDER — FERROUS SULFATE 325 (65 FE) MG PO TABS: 325.0000 mg | ORAL_TABLET | ORAL | 0 refills | Status: DC

## 2022-02-02 NOTE — Telephone Encounter (Signed)
Called patient about labs. Overall unremarkable except Hgb of 8.5. Recommend she takes iron 325mg  every other day. Reports already being constipated, so will also send in miralax for her to use daily as needed.  Patient appreciative  Leeanne Rio, MD

## 2022-02-11 NOTE — Progress Notes (Deleted)
  [facility] Prenatal Visit  Kathleen Morrow is a 26 y.o. [phone] at [redacted]w[redacted]d here for routine follow up. She is dated by midtrimester ultrasound.  She reports {symptoms; pregnancy related:14538}. She reports fetal movement. She denies vaginal bleeding, contractions, or loss of fluid. See flow sheet for details.  There were no vitals filed for this visit.  A/P: Pregnancy at [redacted]w[redacted]d.  Doing well.   Routine prenatal care  Dating reviewed, dating tab is {correct:23336::"correct"} Fetal heart tones {appropriate:23337} Fundal height {fundal height:23342::"within expected range. "} Fetal position confirmed {vertex:23350::"Vertex"} using {ultrasound or Leopolds:23351::"Ultrasound "}.  GBS {collected today :23349::"collected today. "}.  Repeat GC/CT {collected today :23349::"collected today. "} The patient does not have a history of HSV and valacyclovir is not indicated at this time.  Infant feeding choice: Both  Contraception choice: Depo-Provera Infant circumcision desired yes Influenza vaccine previously administered.   Tdap {Tdap:23345::"previously administered between 27-36 weeks "} COVID vaccination was discussed and ***.  Pregnancy education regarding preterm labor, fetal movement,  benefits of breastfeeding, contraception, fetal growth, expected weight gain, and safe infant sleep were discussed.    2. Pregnancy issues include the following and were addressed as appropriate today:   ***  Problem list and pregnancy box updated: {yes/no:20286::"Yes"}.  Follow up 1 week.

## 2022-02-12 ENCOUNTER — Encounter: Payer: Self-pay | Admitting: Student

## 2022-02-19 ENCOUNTER — Ambulatory Visit (INDEPENDENT_AMBULATORY_CARE_PROVIDER_SITE_OTHER): Payer: Medicaid Other | Admitting: Student

## 2022-02-19 ENCOUNTER — Other Ambulatory Visit (HOSPITAL_COMMUNITY)
Admission: RE | Admit: 2022-02-19 | Discharge: 2022-02-19 | Disposition: A | Discharge status: Home / Self Care | Payer: Medicaid Other | Source: Ambulatory Visit | Attending: Family Medicine | Admitting: Family Medicine

## 2022-02-19 VITALS — BP 104/70 | HR 84 | Wt 141.0 lb

## 2022-02-19 DIAGNOSIS — Z3490 Encounter for supervision of normal pregnancy, unspecified, unspecified trimester: Secondary | ICD-10-CM | POA: Insufficient documentation

## 2022-02-19 DIAGNOSIS — D509 Iron deficiency anemia, unspecified: Secondary | ICD-10-CM | POA: Diagnosis not present

## 2022-02-19 LAB — POCT HEMOGLOBIN: Hemoglobin: 8.9 g/dL — AB (ref 11–14.6)

## 2022-02-19 NOTE — Progress Notes (Signed)
  [place] Prenatal Visit  Kathleen Morrow is a 26 y.o. [phone] at [redacted]w[redacted]d here for routine follow up. She is dated by midtrimester ultrasound.  She reports backache. She reports fetal movement. She denies vaginal bleeding, contractions, or loss of fluid. See flow sheet for details.  Vitals:   02/19/22 1536  BP: 104/70  Pulse: 84   A/P: Pregnancy at [redacted]w[redacted]d.  Doing well.   Routine prenatal care  Dating reviewed, dating tab is correct Fetal heart tones Appropriate Fundal height within expected range.  Fetal position confirmed Vertex using Leopold's .  GBS collected today. .  Repeat GC/CT collected today.  The patient does not have a history of HSV and valacyclovir is not indicated at this time.  Infant feeding choice: Both  Contraception choice: Depo-Provera Infant circumcision desired yes Influenza vaccine previously administered.   Tdap not administered.  Received written prescription on this from 9/28 Pregnancy education regarding preterm labor, fetal movement,  benefits of breastfeeding, contraception, fetal growth, expected weight gain, and safe infant sleep were discussed.   2. Pregnancy issues include the following and were addressed as appropriate today:   POC hemoglobin 8.9.  CBC, ferritin today.  Encouraged oral iron every other day. Will need Tdap at next visit (unless patient received this at separate pharmacy)  Problem list and pregnancy box updated: Yes.  Follow up 1 week.

## 2022-02-19 NOTE — Patient Instructions (Addendum)
It was great to see you today! Thank you for choosing [facility] for your primary care. Kathleen Morrow was seen for OB visit.  Today we addressed: Everything looks good except your anemia.  Please start the iron every other day.  Vitamin C (orange juice) helps absorb this a little better.  It should help with your energy deficit.  You have an appointment coming up in 1 week.  If you haven't already, sign up for My Chart to have easy access to your labs results, and communication with your primary care physician.  We are checking some labs today. If they are abnormal, I will call you. If they are normal, I will send you a MyChart message (if it is active) or a letter in the mail. If you do not hear about your labs in the next 2 weeks, please call the office.  You should return to our clinic Return in about 1 week (around 02/26/2022) for OB follow-up. Please arrive 15 minutes before your appointment to ensure smooth check in process.  We appreciate your efforts in making this happen.  Thank you for allowing me to participate in your care, Wells Guiles, DO 02/19/2022, 4:16 PM PGY-2, [place]

## 2022-02-19 NOTE — Assessment & Plan Note (Signed)
Not taking her iron supplement.  POC hemoglobin 8.9.  Very encouraged to take, will obtain CBC and ferritin today.

## 2022-02-20 LAB — CBC
Hematocrit: 29.1 % — ABNORMAL LOW (ref 34.0–46.6)
Hemoglobin: 8.7 g/dL — ABNORMAL LOW (ref 11.1–15.9)
MCH: 21.3 pg — ABNORMAL LOW (ref 26.6–33.0)
MCHC: 29.9 g/dL — ABNORMAL LOW (ref 31.5–35.7)
MCV: 71 fL — ABNORMAL LOW (ref 79–97)
Platelets: 294 10*3/uL (ref 150–450)
RBC: 4.09 x10E6/uL (ref 3.77–5.28)
RDW: 15 % (ref 11.7–15.4)
WBC: 7.6 10*3/uL (ref 3.4–10.8)

## 2022-02-20 LAB — FERRITIN: Ferritin: 10 ng/mL — ABNORMAL LOW (ref 15–150)

## 2022-02-22 LAB — CERVICOVAGINAL ANCILLARY ONLY
Chlamydia: NEGATIVE
Comment: NEGATIVE
Comment: NORMAL
Neisseria Gonorrhea: NEGATIVE

## 2022-02-23 LAB — CULTURE, BETA STREP (GROUP B ONLY): Strep Gp B Culture: NEGATIVE

## 2022-02-25 ENCOUNTER — Telehealth: Payer: Self-pay | Admitting: Student

## 2022-02-25 ENCOUNTER — Other Ambulatory Visit: Payer: Self-pay | Admitting: Student

## 2022-02-25 DIAGNOSIS — D509 Iron deficiency anemia, unspecified: Secondary | ICD-10-CM

## 2022-02-25 NOTE — Telephone Encounter (Signed)
Low ferritin and hemoglobin (8.7).  Recommend IV iron infusion.  Patient amenable to plan, stated she will be at her appointment tomorrow.  I stated that I will place order and she may follow-up for discussion with Dr. Jeani Hawking tomorrow.

## 2022-02-26 ENCOUNTER — Ambulatory Visit (INDEPENDENT_AMBULATORY_CARE_PROVIDER_SITE_OTHER): Payer: Medicaid Other | Admitting: Family Medicine

## 2022-02-26 VITALS — BP 107/67 | HR 99 | Wt 143.0 lb

## 2022-02-26 DIAGNOSIS — Z3A38 38 weeks gestation of pregnancy: Secondary | ICD-10-CM | POA: Diagnosis not present

## 2022-02-26 DIAGNOSIS — O480 Post-term pregnancy: Secondary | ICD-10-CM

## 2022-02-26 DIAGNOSIS — Z23 Encounter for immunization: Secondary | ICD-10-CM

## 2022-02-26 NOTE — Progress Notes (Signed)
  [place] Prenatal Visit  Kathleen Morrow is a 27 y.o. [phone] at [redacted]w[redacted]d here for routine follow up. She is dated by midtrimester ultrasound.  She reports backache, occasional contractions, and vaginal irritation. She reports fetal movement. She denies vaginal bleeding, contractions, or loss of fluid. See flow sheet for details.  Vitals:   02/26/22 1534  BP: 107/67  Pulse: 99    A/P: Pregnancy at [redacted]w[redacted]d.  Doing well.   Routine prenatal care:  Dating reviewed, dating tab is correct Fetal heart tones Appropriate - 135 bpm Fundal height within expected range.  Fetal position confirmed Vertex using Ultrasound .  Infant feeding choice: Both  Contraception choice: Depo-Provera Infant circumcision desired yes Pain control in labor discussed and patient desires epidural.  Influenza vaccine previously administered.  On 01/28/22 Tdap administered today.  GBS and gc/chlamydia testing results were reviewed today - all negative. Pregnancy education regarding labor, fetal movement,  benefits of breastfeeding, contraception, and safe infant sleep were discussed.  Labor and fetal movement precautions reviewed. Induction of labor discussed. Scheduled for induction at approximately 41 weeks. BPP scheduled between 40-41 weeks.   2. Pregnancy issues include the following and were addressed as appropriate today:  Anemia: Iron transfusion ordered. We will need to reach out and call [facility] at [phone] on Monday 10/30 to ensure the patient is scheduled.  TDaP given today.  Labor induction (morning of Nov. 15) and BPP to be scheduled (Nov. 8-14) Problem list and pregnancy box updated: Yes.   Follow up 1 week.    Ezequiel Essex, MD

## 2022-02-26 NOTE — Patient Instructions (Signed)
It was wonderful to see you today. Thank you for allowing me to be a part of your care. Below is a short summary of what we discussed at your visit today:  Pregnancy Keep taking your prenatal vitamin and oral iron supplement.  Your next prenatal appointment is on 11/03. See the next page for upcoming appointments.   In the event that your pregnancy goes past 40 weeks, we have ordered two things for you: A special ultrasound of baby called a BPP. This will happen between Nov. 8 and 14.  Labor induction at the hospital. Scheduled for the morning of Wednesday, Nov. 15th.   Iron transfusion Dr. Madison Hickman has ordered your iron transfusion through the [facility] at [phone]. They should call you to schedule an appointment. If you do not hear from them today (Friday), call on Monday to get this scheduled.   Labor planning  If you experience any vaginal bleeding, leakage of fluids, don't feel your baby moving as much, or start to have contractions less than 5 minutes apart please go directly to the Maternal Assessment Unit at [facility] for evaluation.  Maternity and women's care services located on the [place] side of [facility] [place]. [facility] (Entrance C off [address]).  [address] [place],  [place]  [address]     Please bring all of your medications to every appointment!  If you have any questions or concerns, please do not hesitate to contact us via phone or MyChart message.   Ezequiel Essex, MD

## 2022-03-01 ENCOUNTER — Telehealth: Payer: Self-pay | Admitting: Family Medicine

## 2022-03-01 NOTE — Telephone Encounter (Addendum)
Infusion clinic called back. They have an 0800 on Wednesday. Will call patient with appointment day/time.   Dr. Madison Hickman ordered feraheme 510 mg IVPB once. It is located under the signed and held admission orders, as per protocol for infusion clinic.   Ezequiel Essex, MD

## 2022-03-01 NOTE — Telephone Encounter (Signed)
Called infusion clinic in attempt to schedule this patient's iron infusion. No answer x2, left VM on second attempt. I left her details on the VM, so hopefully they will call her to schedule. I will try to check back in on her chart later today to ensure this has been done.   Ezequiel Essex, MD

## 2022-03-03 ENCOUNTER — Telehealth (HOSPITAL_COMMUNITY): Payer: Self-pay | Admitting: *Deleted

## 2022-03-03 ENCOUNTER — Inpatient Hospital Stay (HOSPITAL_COMMUNITY): Admission: RE | Admit: 2022-03-03 | Payer: Medicaid Other | Source: Ambulatory Visit

## 2022-03-03 NOTE — Telephone Encounter (Signed)
Preadmission screen  

## 2022-03-05 ENCOUNTER — Encounter (HOSPITAL_COMMUNITY): Payer: Self-pay | Admitting: *Deleted

## 2022-03-05 ENCOUNTER — Telehealth (HOSPITAL_COMMUNITY): Payer: Self-pay | Admitting: *Deleted

## 2022-03-05 ENCOUNTER — Ambulatory Visit (INDEPENDENT_AMBULATORY_CARE_PROVIDER_SITE_OTHER): Payer: Medicaid Other | Admitting: Student

## 2022-03-05 VITALS — BP 102/53 | HR 92 | Wt 143.0 lb

## 2022-03-05 DIAGNOSIS — Z3A39 39 weeks gestation of pregnancy: Secondary | ICD-10-CM

## 2022-03-05 DIAGNOSIS — Z3493 Encounter for supervision of normal pregnancy, unspecified, third trimester: Secondary | ICD-10-CM

## 2022-03-05 DIAGNOSIS — D509 Iron deficiency anemia, unspecified: Secondary | ICD-10-CM | POA: Diagnosis not present

## 2022-03-05 DIAGNOSIS — O99013 Anemia complicating pregnancy, third trimester: Secondary | ICD-10-CM | POA: Diagnosis not present

## 2022-03-05 LAB — POCT HEMOGLOBIN: Hemoglobin: 8 g/dL — AB (ref 11–14.6)

## 2022-03-05 NOTE — Progress Notes (Signed)
  [facility] Prenatal Visit  Kathleen Morrow is a 26 y.o. [phone] at [redacted]w[redacted]d here for routine follow up. She is dated by midtrimester ultrasound.  She reports backache and no contractions. Was having some irregular contractions but they seem to have stopped. She reports fetal movement. She denies vaginal bleeding, contractions, or loss of fluid. See flow sheet for details.  There were no vitals filed for this visit.  A/P: Pregnancy at [redacted]w[redacted]d.  Doing well.   Routine prenatal care:  Dating reviewed, dating tab is correct Fetal heart tones Appropriate Fundal height within expected range.  Fetal position confirmed Vertex using Ultrasound .  Infant feeding choice: Both  Contraception choice: Depo-Provera Infant circumcision desired yes Pain control in labor discussed and patient desires epidural.  Influenza vaccine previously administered.  01/28/22 Tdap previously administered between 27-36 weeks  GBS and gc/chlamydia testing results were reviewed today.   Pregnancy education regarding labor, fetal movement,  benefits of breastfeeding, contraception, and safe infant sleep were discussed.  Labor and fetal movement precautions reviewed. Induction of labor discussed. Scheduled for induction at approximately 41 weeks. BPP scheduled between 40-41 weeks on 03/10/22  2. Pregnancy issues include the following and were addressed as appropriate today:  Anemia of Pregnancy Was to get Feraheme on Wednesday but no-showed visit due to transportation issues. Will check Hgb today and encouraged patient to call infusion center to reschedule. Continues to take PNV with iron,.   Problem list and pregnancy box updated: Yes.   Follow up 1 week.

## 2022-03-05 NOTE — Telephone Encounter (Signed)
Preadmission screen  

## 2022-03-05 NOTE — Patient Instructions (Signed)
Kathleen Morrow,  Please call the infusion center at [phone] to reschedule your iron infusion. Keep taking your prenatal vitamin with iron.    Prenatal Classes Go to [email or web address] for more information on the pregnancy and child birth classes that [facility] has to offer.   Pregnancy Related Return Precautions The follow are signs/symptoms that are abnormal in pregnancy and may require further evaluation by a physician: Go to the MAU at [place] at [facility] if: You have cramping/contractions that do not go away with drinking water, especially if they are lasting 30 seconds to 1.5 minutes, coming and going every 5-10 minutes for an hour or more, or are getting stronger and you cannot walk or talk while having a contraction/cramp. Your water breaks.  Sometimes it is a big gush of fluid, sometimes it is just a trickle that keeps getting your underwear wet or running down your legs You have vaginal bleeding.    You do not feel your baby moving like normal.  If you do not, get something to eat and drink (something cold or something with sugar like peanut butter or juice) and lay down and focus on feeling your baby move. If your baby is still not moving like normal, you should go to MAU. You should feel your baby move 6 times in one hour, or 10 times in two hours. You have a persistent headache that does not go away with 1 g of Tylenol, vision changes, chest pain, difficulty breathing, severe pain in your right upper abdomen, worsening leg swelling- these can all be signs of high blood pressure in pregnancy and need to be evaluated by a provider immediately  These are all concerning in pregnancy and if you have any of these I recommend you call your PCP and present to the Maternity Admissions Unit (map below) for further evaluation.  For any pregnancy-related emergencies, please go to the Maternity Admissions Unit in the [place] at [place] will use hospital Entrance C.    Our clinic number is [phone].   Dr Thompson Grayer

## 2022-03-07 ENCOUNTER — Inpatient Hospital Stay (HOSPITAL_COMMUNITY)
Admission: RE | Admit: 2022-03-07 | Discharge: 2022-03-08 | Disposition: A | Discharge status: Home / Self Care | Payer: Medicaid Other | Attending: Obstetrics & Gynecology | Admitting: Obstetrics & Gynecology

## 2022-03-07 ENCOUNTER — Encounter (HOSPITAL_COMMUNITY): Payer: Self-pay | Admitting: Obstetrics & Gynecology

## 2022-03-07 DIAGNOSIS — E860 Dehydration: Secondary | ICD-10-CM | POA: Diagnosis not present

## 2022-03-07 DIAGNOSIS — O99613 Diseases of the digestive system complicating pregnancy, third trimester: Secondary | ICD-10-CM | POA: Diagnosis not present

## 2022-03-07 DIAGNOSIS — Z3689 Encounter for other specified antenatal screening: Secondary | ICD-10-CM | POA: Diagnosis not present

## 2022-03-07 DIAGNOSIS — R1111 Vomiting without nausea: Secondary | ICD-10-CM | POA: Diagnosis not present

## 2022-03-07 DIAGNOSIS — O99013 Anemia complicating pregnancy, third trimester: Secondary | ICD-10-CM | POA: Diagnosis not present

## 2022-03-07 DIAGNOSIS — D509 Iron deficiency anemia, unspecified: Secondary | ICD-10-CM | POA: Insufficient documentation

## 2022-03-07 DIAGNOSIS — O99283 Endocrine, nutritional and metabolic diseases complicating pregnancy, third trimester: Secondary | ICD-10-CM | POA: Insufficient documentation

## 2022-03-07 DIAGNOSIS — O99019 Anemia complicating pregnancy, unspecified trimester: Secondary | ICD-10-CM | POA: Diagnosis not present

## 2022-03-07 DIAGNOSIS — O471 False labor at or after 37 completed weeks of gestation: Secondary | ICD-10-CM | POA: Insufficient documentation

## 2022-03-07 DIAGNOSIS — O26893 Other specified pregnancy related conditions, third trimester: Secondary | ICD-10-CM | POA: Insufficient documentation

## 2022-03-07 DIAGNOSIS — I959 Hypotension, unspecified: Secondary | ICD-10-CM | POA: Diagnosis not present

## 2022-03-07 DIAGNOSIS — E876 Hypokalemia: Secondary | ICD-10-CM | POA: Diagnosis not present

## 2022-03-07 DIAGNOSIS — K2100 Gastro-esophageal reflux disease with esophagitis, without bleeding: Secondary | ICD-10-CM | POA: Insufficient documentation

## 2022-03-07 DIAGNOSIS — Z3A39 39 weeks gestation of pregnancy: Secondary | ICD-10-CM | POA: Insufficient documentation

## 2022-03-07 DIAGNOSIS — K920 Hematemesis: Secondary | ICD-10-CM | POA: Diagnosis not present

## 2022-03-07 DIAGNOSIS — R000 Tachycardia, unspecified: Secondary | ICD-10-CM | POA: Diagnosis not present

## 2022-03-07 LAB — CBC
HCT: 27.8 % — ABNORMAL LOW (ref 36.0–46.0)
Hemoglobin: 8.1 g/dL — ABNORMAL LOW (ref 12.0–15.0)
MCH: 20.5 pg — ABNORMAL LOW (ref 26.0–34.0)
MCHC: 29.1 g/dL — ABNORMAL LOW (ref 30.0–36.0)
MCV: 70.2 fL — ABNORMAL LOW (ref 80.0–100.0)
Platelets: 301 10*3/uL (ref 150–400)
RBC: 3.96 MIL/uL (ref 3.87–5.11)
RDW: 16.9 % — ABNORMAL HIGH (ref 11.5–15.5)
WBC: 9.6 10*3/uL (ref 4.0–10.5)
nRBC: 0 % (ref 0.0–0.2)

## 2022-03-07 MED ORDER — LIDOCAINE VISCOUS HCL 2 % MT SOLN
15.0000 mL | Freq: Once | OROMUCOSAL | Status: AC
  Administered 2022-03-07: 15 mL via ORAL
  Filled 2022-03-07: qty 15

## 2022-03-07 MED ORDER — LACTATED RINGERS IV BOLUS
1000.0000 mL | Freq: Once | INTRAVENOUS | Status: AC
  Administered 2022-03-07: 1000 mL via INTRAVENOUS

## 2022-03-07 MED ORDER — SODIUM CHLORIDE 0.9 % IV SOLN
510.0000 mg | Freq: Once | INTRAVENOUS | Status: AC
  Administered 2022-03-08: 510 mg via INTRAVENOUS
  Filled 2022-03-07: qty 17

## 2022-03-07 MED ORDER — FAMOTIDINE IN NACL 20-0.9 MG/50ML-% IV SOLN
20.0000 mg | Freq: Once | INTRAVENOUS | Status: AC
  Administered 2022-03-07: 20 mg via INTRAVENOUS
  Filled 2022-03-07: qty 50

## 2022-03-07 MED ORDER — ALUM & MAG HYDROXIDE-SIMETH 200-200-20 MG/5ML PO SUSP
30.0000 mL | Freq: Once | ORAL | Status: AC
  Administered 2022-03-07: 30 mL via ORAL
  Filled 2022-03-07: qty 30

## 2022-03-07 MED ORDER — ONDANSETRON HCL 4 MG/2ML IJ SOLN
4.0000 mg | Freq: Once | INTRAMUSCULAR | Status: AC
  Administered 2022-03-07: 4 mg via INTRAVENOUS
  Filled 2022-03-07: qty 2

## 2022-03-07 NOTE — MAU Note (Signed)
.  EMALEY APPLIN is a 26 y.o. at [redacted]w[redacted]d here in MAU reporting: 2 days ago she began vomiting and unable to hold down food or liquid. Today symptoms became worse and pt reports feeling weak. Reports bright and dark red blood. Chest pain that she describes as burning sensation. Upper epigastric pain and burning. Denies SROM, vaginal bleeding or bloody show. Feeling irregular contractions and vaginal pressure. Headache x 2 days -pt reports history of migraines but feels the headache is from lack of food.  Onset of complaint: 03/05/2024  Pain score: 10 Vitals:   03/07/22 2223  BP: (!) 104/57  Pulse: 96  Resp: 18  Temp: 98.7 F (37.1 C)  SpO2: 96%     FHT:165 Lab orders placed from triage:  None

## 2022-03-07 NOTE — MAU Provider Note (Signed)
History     CSN: [id number]  Arrival date and time: 03/07/22 2213   Event Date/Time   First Provider Initiated Contact with Patient 03/07/22 2248      Chief Complaint  Patient presents with   Abdominal Pain   Fatigue   Emesis   Contractions   26 y.o. [id number] @39 .4 wks presenting via EMS with vomiting. Reports severe burning in chest that causes her to throw up. Rates pain 8/10. Reports onset 2 days ago. Mostly happening at night when she lays down. Reports emesis has blood, mostly brown but also saw dark red tonight. States she is unable to eat or drink without throwing up. Denies fevers or diarrhea. States her child and partner recently had a cold and she had a sore throat 2 days ago. Reports irregular ctx. Reports good FM.     OB History     Gravida  5   Para  1   Term  1   Preterm  0   AB  3   Living  1      SAB      IAB      Ectopic      Multiple  0   Live Births  1           Past Medical History:  Diagnosis Date   Anemia    Asthma    BEHAVIOR PROBLEM 09/25/2008   Qualifier: Diagnosis of  By: Carmie End MD, [place]     Encounter for vaginal delivery 12/15/2019   Prenatal care in third trimester 10/23/2019    Past Surgical History:  Procedure Laterality Date   NO PAST SURGERIES      Family History  Problem Relation Age of Onset   Varicose Veins Mother    Varicose Veins Brother     Social History   Tobacco Use   Smoking status: Former   Smokeless tobacco: Never  Scientific laboratory technician Use: Never used  Substance Use Topics   Alcohol use: No   Drug use: No    Comment: THC positive during pregnancy and at admission    Allergies:  Allergies  Allergen Reactions   Shellfish Allergy Hives and Anaphylaxis    Facial swelling unknonw Facial swelling    Bee Pollen    Pollen Extract     Medications Prior to Admission  Medication Sig Dispense Refill Last Dose   doxylamine, Sleep, (UNISOM) 25 MG tablet Take 25 mg by mouth at bedtime as  needed. (Patient not taking: Reported on 12/24/2021)      ferrous sulfate 325 (65 FE) MG tablet Take 1 tablet (325 mg total) by mouth every other day. 45 tablet 0    pantoprazole (PROTONIX) 40 MG tablet Take 1 tablet (40 mg total) by mouth daily. 30 tablet 3    polyethylene glycol powder (MIRALAX) 17 GM/SCOOP powder Take 17 g by mouth daily as needed (constipation). 255 g 0    Prenatal Vit-Fe Fumarate-FA (PRENATAL VITAMIN) 27-0.8 MG TABS Take 1 tablet by mouth daily. 90 tablet 1     Review of Systems  Constitutional:  Positive for fatigue. Negative for fever.  Gastrointestinal:  Positive for nausea and vomiting. Negative for abdominal pain.  Neurological:  Positive for weakness and headaches.   Physical Exam   Blood pressure (!) 104/57, pulse 96, temperature 98.7 F (37.1 C), temperature source Oral, resp. rate 18, SpO2 97 %.  Physical Exam Vitals and nursing note reviewed.  Constitutional:      General:  She is in acute distress (tearful).     Appearance: Normal appearance.  HENT:     Head: Normocephalic and atraumatic.  Cardiovascular:     Rate and Rhythm: Normal rate.  Pulmonary:     Effort: Pulmonary effort is normal. No respiratory distress.  Abdominal:     Palpations: Abdomen is soft.     Tenderness: There is no abdominal tenderness.     Comments: gravid  Musculoskeletal:        General: Normal range of motion.     Cervical back: Normal range of motion.  Skin:    General: Skin is warm and dry.  Neurological:     General: No focal deficit present.     Mental Status: She is alert and oriented to person, place, and time.  Psychiatric:        Mood and Affect: Mood normal.        Behavior: Behavior normal.   EFM: 150 bpm, mod variability, + accels, no decels Toco: irreg  No results found for this or any previous visit (from the past 24 hour(s)).  MAU Course  Procedures LR Zofran Pepcid GI cocktail Feraheme  MDM Labs ordered and reviewed.   Assessment and  Plan  ***  Julianne Handler, CNM 03/07/2022, 11:00 PM

## 2022-03-08 DIAGNOSIS — K2100 Gastro-esophageal reflux disease with esophagitis, without bleeding: Secondary | ICD-10-CM | POA: Diagnosis not present

## 2022-03-08 DIAGNOSIS — D509 Iron deficiency anemia, unspecified: Secondary | ICD-10-CM | POA: Diagnosis not present

## 2022-03-08 DIAGNOSIS — O99019 Anemia complicating pregnancy, unspecified trimester: Secondary | ICD-10-CM | POA: Diagnosis not present

## 2022-03-08 DIAGNOSIS — E860 Dehydration: Secondary | ICD-10-CM

## 2022-03-08 DIAGNOSIS — Z3689 Encounter for other specified antenatal screening: Secondary | ICD-10-CM | POA: Diagnosis not present

## 2022-03-08 DIAGNOSIS — E876 Hypokalemia: Secondary | ICD-10-CM | POA: Diagnosis not present

## 2022-03-08 DIAGNOSIS — Z3A39 39 weeks gestation of pregnancy: Secondary | ICD-10-CM

## 2022-03-08 LAB — URINALYSIS, ROUTINE W REFLEX MICROSCOPIC
Bacteria, UA: NONE SEEN
Bilirubin Urine: NEGATIVE
Glucose, UA: NEGATIVE mg/dL
Hgb urine dipstick: NEGATIVE
Ketones, ur: 80 mg/dL — AB
Leukocytes,Ua: NEGATIVE
Nitrite: NEGATIVE
Protein, ur: 30 mg/dL — AB
Specific Gravity, Urine: 1.025 (ref 1.005–1.030)
pH: 6 (ref 5.0–8.0)

## 2022-03-08 LAB — COMPREHENSIVE METABOLIC PANEL
ALT: 11 U/L (ref 0–44)
AST: 17 U/L (ref 15–41)
Albumin: 2.8 g/dL — ABNORMAL LOW (ref 3.5–5.0)
Alkaline Phosphatase: 142 U/L — ABNORMAL HIGH (ref 38–126)
Anion gap: 11 (ref 5–15)
BUN: <5 mg/dL — ABNORMAL LOW (ref 6–20)
CO2: 21 mmol/L — ABNORMAL LOW (ref 22–32)
Calcium: 8.4 mg/dL — ABNORMAL LOW (ref 8.9–10.3)
Chloride: 106 mmol/L (ref 98–111)
Creatinine, Ser: 0.56 mg/dL (ref 0.44–1.00)
GFR, Estimated: >60 mL/min (ref >60)
Glucose, Bld: 85 mg/dL (ref 70–99)
Potassium: 3.3 mmol/L — ABNORMAL LOW (ref 3.5–5.1)
Sodium: 138 mmol/L (ref 135–145)
Total Bilirubin: 1.3 mg/dL — ABNORMAL HIGH (ref 0.3–1.2)
Total Protein: 5.9 g/dL — ABNORMAL LOW (ref 6.5–8.1)

## 2022-03-08 MED ORDER — SODIUM CHLORIDE 0.9 % IV SOLN: INTRAVENOUS | Status: DC

## 2022-03-08 MED ORDER — NIFEDIPINE 10 MG PO CAPS
ORAL_CAPSULE | ORAL | Status: AC
  Filled 2022-03-08: qty 1

## 2022-03-08 MED ORDER — ACETAMINOPHEN-CAFFEINE 500-65 MG PO TABS
2.0000 | ORAL_TABLET | Freq: Once | ORAL | Status: AC
  Administered 2022-03-08: 2 via ORAL
  Filled 2022-03-08: qty 2

## 2022-03-08 MED ORDER — FAMOTIDINE 20 MG PO TABS: 20.0000 mg | ORAL_TABLET | Freq: Every day | ORAL | 1 refills | Status: DC

## 2022-03-10 ENCOUNTER — Telehealth: Payer: Self-pay | Admitting: Obstetrics and Gynecology

## 2022-03-10 ENCOUNTER — Other Ambulatory Visit: Payer: Self-pay | Admitting: Family Medicine

## 2022-03-10 ENCOUNTER — Ambulatory Visit: Payer: Medicaid Other | Admitting: *Deleted

## 2022-03-10 ENCOUNTER — Other Ambulatory Visit: Payer: Self-pay | Admitting: Advanced Practice Midwife

## 2022-03-10 ENCOUNTER — Ambulatory Visit: Payer: Medicaid Other | Attending: Obstetrics | Admitting: Obstetrics

## 2022-03-10 ENCOUNTER — Ambulatory Visit: Payer: Medicaid Other | Attending: Family Medicine

## 2022-03-10 VITALS — BP 94/48 | HR 67

## 2022-03-10 DIAGNOSIS — J45909 Unspecified asthma, uncomplicated: Secondary | ICD-10-CM | POA: Diagnosis not present

## 2022-03-10 DIAGNOSIS — Z3A40 40 weeks gestation of pregnancy: Secondary | ICD-10-CM

## 2022-03-10 DIAGNOSIS — O480 Post-term pregnancy: Secondary | ICD-10-CM

## 2022-03-10 DIAGNOSIS — O2693 Pregnancy related conditions, unspecified, third trimester: Secondary | ICD-10-CM

## 2022-03-10 DIAGNOSIS — O99513 Diseases of the respiratory system complicating pregnancy, third trimester: Secondary | ICD-10-CM | POA: Diagnosis not present

## 2022-03-10 DIAGNOSIS — O0933 Supervision of pregnancy with insufficient antenatal care, third trimester: Secondary | ICD-10-CM

## 2022-03-10 NOTE — Progress Notes (Signed)
MFM Note  Kathleen Morrow was seen for postdates testing.  She is 40 weeks today.  She denies any problems in her current pregnancy.    She is scheduled for induction on March 17, 2022 should she remain undelivered.  The overall EFW obtained today was 7 pounds 15 ounces (49th percentile).  There was normal amniotic fluid noted.  A BPP performed today was 8 out of 10 with a reactive NST.  She received a -2 for fetal breathing movements that did not meet criteria.  As she is postdates with an 8 out of 10 BPP, delivery is recommended soon.    I discussed her case with the second attending.  She will be scheduled for induction by the end of this week.  She will return on Friday for another NST.  The patient is happy with the plan for induction sooner than next week.  She stated that all of her questions were answered.  A total of 20 minutes was spent counseling and coordinating the care for this patient.  Greater than 50% of the time was spent in direct face-to-face contact.

## 2022-03-10 NOTE — Procedures (Signed)
Kathleen Morrow [birth year] [redacted]w[redacted]d  Fetus A Non-Stress Test Interpretation for 03/10/22  Indication:  postdates  Fetal Heart Rate A Mode: External Baseline Rate (A): 140 bpm Variability: Moderate Accelerations: 15 x 15 Decelerations: None Multiple birth?: No  Uterine Activity Mode: Palpation, Toco Contraction Frequency (min): 1 uc Contraction Quality: Mild Resting Tone Palpated: Relaxed Resting Time: Adequate  Interpretation (Fetal Testing) Nonstress Test Interpretation: Reactive Overall Impression: Reassuring for gestational age Comments: Dr. Parke Poisson reviewed tracing

## 2022-03-10 NOTE — Telephone Encounter (Addendum)
I called the patient to inform her of IOL change. I had hoped to move IOL to tomorrow but no availability tomorrow per staff on L&D. Spoke with Dr. Parke Poisson who agrees with IOL on Saturday (first available) but with NST in the office on Friday. Urgent message sent to [facility] to arrange NST and inform pt.   Left voicemail for the patient. She does not have MyChart for messages available. Will try her once more before 5pm.  Reviewed if any decreased fetal movement, S/Sx of labor or vaginal bleeding to come to the MAU.   Milas Hock, MD Attending Obstetrician & Gynecologist, [facility] for [facility], [facility]

## 2022-03-10 NOTE — Telephone Encounter (Signed)
Called pt a second time. Left voicemail that NST would be on Thursday and she would get a call from the office. Again LVM with info to come to hospital if decreased fetal movement, S/Sx bleeding, etc. Informed pt of IOL on Saturday and she should hear from labor and delivery for when to come in.   Milas Hock, MD Attending Obstetrician & Gynecologist, [facility] for [facility], [facility]

## 2022-03-11 ENCOUNTER — Ambulatory Visit (INDEPENDENT_AMBULATORY_CARE_PROVIDER_SITE_OTHER): Payer: Medicaid Other | Admitting: *Deleted

## 2022-03-11 VITALS — BP 110/62 | HR 92

## 2022-03-11 DIAGNOSIS — O480 Post-term pregnancy: Secondary | ICD-10-CM

## 2022-03-11 NOTE — Progress Notes (Signed)
IOL instructions for 11/11 reviewed w/pt. She was advised to go to hospital if decreased fetal movement or sx of labor occurs. She voiced understanding.

## 2022-03-13 ENCOUNTER — Inpatient Hospital Stay (HOSPITAL_COMMUNITY)
Admission: RE | Admit: 2022-03-13 | Discharge: 2022-03-13 | Disposition: A | Discharge status: Home / Self Care | Payer: Medicaid Other | Source: Ambulatory Visit | Attending: Family Medicine | Admitting: Family Medicine

## 2022-03-15 ENCOUNTER — Inpatient Hospital Stay (HOSPITAL_COMMUNITY): Payer: Medicaid Other | Admitting: Anesthesiology

## 2022-03-15 ENCOUNTER — Encounter (HOSPITAL_COMMUNITY): Payer: Self-pay | Admitting: Obstetrics and Gynecology

## 2022-03-15 ENCOUNTER — Other Ambulatory Visit: Payer: Self-pay

## 2022-03-15 ENCOUNTER — Inpatient Hospital Stay (HOSPITAL_COMMUNITY)
Admission: AD | Admit: 2022-03-15 | Discharge: 2022-03-17 | DRG: 807 | Disposition: A | Discharge status: Home / Self Care | Payer: Medicaid Other | Attending: Obstetrics and Gynecology | Admitting: Obstetrics and Gynecology

## 2022-03-15 DIAGNOSIS — D509 Iron deficiency anemia, unspecified: Secondary | ICD-10-CM | POA: Diagnosis not present

## 2022-03-15 DIAGNOSIS — Z3A40 40 weeks gestation of pregnancy: Secondary | ICD-10-CM | POA: Diagnosis not present

## 2022-03-15 DIAGNOSIS — J45909 Unspecified asthma, uncomplicated: Secondary | ICD-10-CM | POA: Diagnosis not present

## 2022-03-15 DIAGNOSIS — O9902 Anemia complicating childbirth: Secondary | ICD-10-CM | POA: Diagnosis not present

## 2022-03-15 DIAGNOSIS — D649 Anemia, unspecified: Secondary | ICD-10-CM | POA: Diagnosis not present

## 2022-03-15 DIAGNOSIS — Z87891 Personal history of nicotine dependence: Secondary | ICD-10-CM | POA: Diagnosis not present

## 2022-03-15 DIAGNOSIS — O480 Post-term pregnancy: Secondary | ICD-10-CM | POA: Diagnosis not present

## 2022-03-15 DIAGNOSIS — O9952 Diseases of the respiratory system complicating childbirth: Secondary | ICD-10-CM | POA: Diagnosis not present

## 2022-03-15 HISTORY — DX: Post-term pregnancy: O48.0

## 2022-03-15 LAB — TYPE AND SCREEN
ABO/RH(D): O POS
Antibody Screen: NEGATIVE

## 2022-03-15 LAB — CBC
HCT: 32.8 % — ABNORMAL LOW (ref 36.0–46.0)
Hemoglobin: 9.3 g/dL — ABNORMAL LOW (ref 12.0–15.0)
MCH: 21 pg — ABNORMAL LOW (ref 26.0–34.0)
MCHC: 28.4 g/dL — ABNORMAL LOW (ref 30.0–36.0)
MCV: 74.2 fL — ABNORMAL LOW (ref 80.0–100.0)
Platelets: 306 10*3/uL (ref 150–400)
RBC: 4.42 MIL/uL (ref 3.87–5.11)
RDW: 22.5 % — ABNORMAL HIGH (ref 11.5–15.5)
WBC: 7.4 10*3/uL (ref 4.0–10.5)
nRBC: 0.3 % — ABNORMAL HIGH (ref 0.0–0.2)

## 2022-03-15 LAB — RPR: RPR Ser Ql: NONREACTIVE

## 2022-03-15 MED ORDER — MISOPROSTOL 50MCG HALF TABLET: 50.0000 ug | ORAL_TABLET | Freq: Once | ORAL | Status: DC

## 2022-03-15 MED ORDER — OXYCODONE-ACETAMINOPHEN 5-325 MG PO TABS: 2.0000 | ORAL_TABLET | ORAL | Status: DC | PRN

## 2022-03-15 MED ORDER — PHENYLEPHRINE 80 MCG/ML (10ML) SYRINGE FOR IV PUSH (FOR BLOOD PRESSURE SUPPORT): 80.0000 ug | PREFILLED_SYRINGE | INTRAVENOUS | Status: DC | PRN

## 2022-03-15 MED ORDER — EPHEDRINE 5 MG/ML INJ: 10.0000 mg | INTRAVENOUS | Status: DC | PRN

## 2022-03-15 MED ORDER — FENTANYL CITRATE (PF) 100 MCG/2ML IJ SOLN: 100.0000 ug | INTRAMUSCULAR | Status: DC | PRN

## 2022-03-15 MED ORDER — TERBUTALINE SULFATE 1 MG/ML IJ SOLN: 0.2500 mg | Freq: Once | INTRAMUSCULAR | Status: DC | PRN

## 2022-03-15 MED ORDER — PHENYLEPHRINE 80 MCG/ML (10ML) SYRINGE FOR IV PUSH (FOR BLOOD PRESSURE SUPPORT)
80.0000 ug | PREFILLED_SYRINGE | INTRAVENOUS | Status: DC | PRN
  Administered 2022-03-15: 80 ug via INTRAVENOUS
  Filled 2022-03-15: qty 10

## 2022-03-15 MED ORDER — LACTATED RINGERS IV SOLN: INTRAVENOUS | Status: DC

## 2022-03-15 MED ORDER — TERBUTALINE SULFATE 1 MG/ML IJ SOLN
0.2500 mg | Freq: Once | INTRAMUSCULAR | Status: AC | PRN
  Administered 2022-03-15: 0.25 mg via SUBCUTANEOUS
  Filled 2022-03-15: qty 1

## 2022-03-15 MED ORDER — ACETAMINOPHEN 325 MG PO TABS
650.0000 mg | ORAL_TABLET | ORAL | Status: DC | PRN
  Administered 2022-03-15: 650 mg via ORAL
  Filled 2022-03-15: qty 2

## 2022-03-15 MED ORDER — LACTATED RINGERS IV SOLN
500.0000 mL | INTRAVENOUS | Status: DC | PRN
  Administered 2022-03-15: 500 mL via INTRAVENOUS

## 2022-03-15 MED ORDER — SOD CITRATE-CITRIC ACID 500-334 MG/5ML PO SOLN
30.0000 mL | ORAL | Status: DC | PRN
  Administered 2022-03-15 (×2): 30 mL via ORAL
  Filled 2022-03-15 (×2): qty 30

## 2022-03-15 MED ORDER — OXYTOCIN BOLUS FROM INFUSION
333.0000 mL | Freq: Once | INTRAVENOUS | Status: AC
  Administered 2022-03-16: 333 mL via INTRAVENOUS

## 2022-03-15 MED ORDER — LIDOCAINE HCL (PF) 1 % IJ SOLN: 30.0000 mL | INTRAMUSCULAR | Status: DC | PRN

## 2022-03-15 MED ORDER — LIDOCAINE HCL (PF) 1 % IJ SOLN
INTRAMUSCULAR | Status: DC | PRN
  Administered 2022-03-15: 10 mL via EPIDURAL

## 2022-03-15 MED ORDER — DIPHENHYDRAMINE HCL 50 MG/ML IJ SOLN: 12.5000 mg | INTRAMUSCULAR | Status: DC | PRN

## 2022-03-15 MED ORDER — FENTANYL-BUPIVACAINE-NACL 0.5-0.125-0.9 MG/250ML-% EP SOLN
12.0000 mL/h | EPIDURAL | Status: DC | PRN
  Administered 2022-03-15: 12 mL/h via EPIDURAL
  Filled 2022-03-15: qty 250

## 2022-03-15 MED ORDER — OXYTOCIN-SODIUM CHLORIDE 30-0.9 UT/500ML-% IV SOLN
2.5000 [IU]/h | INTRAVENOUS | Status: DC
  Administered 2022-03-16: 2.5 [IU]/h via INTRAVENOUS

## 2022-03-15 MED ORDER — ONDANSETRON HCL 4 MG/2ML IJ SOLN
4.0000 mg | Freq: Four times a day (QID) | INTRAMUSCULAR | Status: DC | PRN
  Administered 2022-03-15 (×2): 4 mg via INTRAVENOUS
  Filled 2022-03-15 (×2): qty 2

## 2022-03-15 MED ORDER — LACTATED RINGERS IV SOLN: 500.0000 mL | Freq: Once | INTRAVENOUS | Status: DC

## 2022-03-15 MED ORDER — OXYCODONE-ACETAMINOPHEN 5-325 MG PO TABS: 1.0000 | ORAL_TABLET | ORAL | Status: DC | PRN

## 2022-03-15 MED ORDER — OXYTOCIN-SODIUM CHLORIDE 30-0.9 UT/500ML-% IV SOLN
1.0000 m[IU]/min | INTRAVENOUS | Status: DC
  Administered 2022-03-15 (×2): 2 m[IU]/min via INTRAVENOUS
  Filled 2022-03-15: qty 500

## 2022-03-15 MED ORDER — FAMOTIDINE IN NACL 20-0.9 MG/50ML-% IV SOLN
20.0000 mg | Freq: Once | INTRAVENOUS | Status: AC
  Administered 2022-03-15: 20 mg via INTRAVENOUS
  Filled 2022-03-15: qty 50

## 2022-03-15 MED ORDER — MISOPROSTOL 25 MCG QUARTER TABLET: 25.0000 ug | ORAL_TABLET | Freq: Once | ORAL | Status: DC

## 2022-03-15 MED ORDER — SODIUM CHLORIDE 0.9 % IV SOLN: 510.0000 mg | Freq: Once | INTRAVENOUS | Status: DC

## 2022-03-15 NOTE — Anesthesia Procedure Notes (Signed)
Epidural Patient location during procedure: OB Start time: 03/15/2022 1:40 PM End time: 03/15/2022 1:44 PM  Staffing Anesthesiologist: Leilani Able, MD Performed: anesthesiologist   Preanesthetic Checklist Completed: patient identified, IV checked, site marked, risks and benefits discussed, surgical consent, monitors and equipment checked, pre-op evaluation and timeout performed  Epidural Patient position: sitting Prep: DuraPrep and site prepped and draped Patient monitoring: continuous pulse ox and blood pressure Approach: midline Location: L3-L4 Injection technique: LOR air  Needle:  Needle type: Tuohy  Needle gauge: 17 G Needle length: 9 cm and 9 Needle insertion depth: 6 cm Catheter type: closed end flexible Catheter size: 19 Gauge Catheter at skin depth: 11 cm Test dose: negative and Other  Assessment Events: blood not aspirated, injection not painful, no injection resistance, no paresthesia and negative IV test  Additional Notes Reason for block:procedure for pain

## 2022-03-15 NOTE — H&P (Signed)
OBSTETRIC ADMISSION HISTORY AND PHYSICAL  Kathleen Morrow is a 26 y.o. female [phone] with IUP at [redacted]w[redacted]d by late u/s presenting for IOL for PD . She reports +FMs, No LOF, no VB, no blurry vision, headaches or peripheral edema, and RUQ pain.  She plans on breast and formula feeding. She request depo for birth control. She received her prenatal care at  [facility]    Dating: By 17 wk u/s --->  Estimated Date of Delivery: 03/10/22  Sono:    @[redacted]w[redacted]d , CWD, normal anatomy, cephalic presentation, anterior placenta, 3604g, 49% EFW   Prenatal History/Complications: Late to prenatal care, anemia in pregnancy   Past Medical History: Past Medical History:  Diagnosis Date   Anemia    Asthma    BEHAVIOR PROBLEM 09/25/2008   Qualifier: Diagnosis of  By: 09/27/2008 MD, Erin     Encounter for vaginal delivery 12/15/2019   Prenatal care in third trimester 10/23/2019    Past Surgical History: Past Surgical History:  Procedure Laterality Date   NO PAST SURGERIES      Obstetrical History: OB History     Gravida  5   Para  1   Term  1   Preterm  0   AB  3   Living  1      SAB      IAB      Ectopic      Multiple  0   Live Births  1           Social History Social History   Socioeconomic History   Marital status: Single    Spouse name: Not on file   Number of children: Not on file   Years of education: Not on file   Highest education level: Not on file  Occupational History   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   Tobacco Use   Smoking status: Former   Smokeless tobacco: Never  Vaping Use   Vaping Use: Never used  Substance and Sexual Activity   Alcohol use: No   Drug use: No    Comment: last use [facility] July 2023   Sexual activity: Yes  Other Topics Concern   Not on file  Social History Narrative   Not on file   Social Determinants of Health   Financial Resource Strain: Not on file  Food Insecurity: Food Insecurity Present (03/15/2022)   Hunger  Vital Sign    Worried About Running Out of Food in the Last Year: Often true    Ran Out of Food in the Last Year: Sometimes true  Transportation Needs: No Transportation Needs (03/15/2022)   PRAPARE - 03/17/2022 (Medical): No    Lack of Transportation (Non-Medical): No  Physical Activity: Not on file  Stress: Not on file  Social Connections: Not on file    Family History: Family History  Problem Relation Age of Onset   Asthma Mother    Varicose Veins Mother    Varicose Veins Brother    Birth defects Neg Hx    Cancer Neg Hx    Diabetes Neg Hx    Heart disease Neg Hx    Hypertension Neg Hx     Allergies: Allergies  Allergen Reactions   Shellfish Allergy Hives and Anaphylaxis    Facial swelling unknonw Facial swelling    Bee Pollen    Pollen Extract     Medications Prior to Admission  Medication Sig Dispense Refill Last Dose   famotidine (  PEPCID) 20 MG tablet Take 1 tablet (20 mg total) by mouth at bedtime. 30 tablet 1    ferrous sulfate 325 (65 FE) MG tablet Take 1 tablet (325 mg total) by mouth every other day. 45 tablet 0    pantoprazole (PROTONIX) 40 MG tablet Take 1 tablet (40 mg total) by mouth daily. 30 tablet 3    polyethylene glycol powder (MIRALAX) 17 GM/SCOOP powder Take 17 g by mouth daily as needed (constipation). (Patient not taking: Reported on 03/10/2022) 255 g 0    Prenatal Vit-Fe Fumarate-FA (PRENATAL VITAMIN) 27-0.8 MG TABS Take 1 tablet by mouth daily. 90 tablet 1      Review of Systems   All systems reviewed and negative except as stated in HPI  Blood pressure 115/70, pulse 88, temperature 98.3 F (36.8 C), temperature source Oral, resp. rate 18. General appearance: alert, cooperative, and appears stated age Lungs: Normal work of breathing  Heart: regular rate Abdomen: soft, non-tender; bowel sounds normal Extremities: Homans sign is negative, no sign of DVT Presentation: cephalic Fetal monitoringBaseline: 135  bpm, Variability: Good {> 6 bpm), Accelerations: Reactive, and Decelerations: Absent Uterine activity occasional  Dilation: 3.5 Effacement (%): 80 Station: -1 Exam by:: Polos RN   Prenatal labs: ABO, Rh: --/--/PENDING (11/13 0840) Antibody: PENDING (11/13 0840) Rubella: 6.62 (05/25 0936) RPR: Non Reactive (09/28 1122)  HBsAg: Negative (05/25 0936)  HIV: Non Reactive (09/28 1122)  GBS: Negative/-- (10/20 1634)  1 hr Glucola Normal  Genetic screening  LR  Anatomy US Normal   Prenatal Transfer Tool  Maternal Diabetes: No Genetic Screening: Normal Maternal Ultrasounds/Referrals: Normal Fetal Ultrasounds or other Referrals:  None Maternal Substance Abuse:  No Significant Maternal Medications:  None Significant Maternal Lab Results:  Group B Strep negative Number of Prenatal Visits:greater than 3 verified prenatal visits Other Comments:  None  Results for orders placed or performed during the hospital encounter of 03/15/22 (from the past 24 hour(s))  Type and screen   Collection Time: 03/15/22  8:40 AM  Result Value Ref Range   ABO/RH(D) PENDING    Antibody Screen PENDING    Sample Expiration      03/18/2022,2359 Performed at [facility], [address], [place], [place] [address]   CBC   Collection Time: 03/15/22  8:48 AM  Result Value Ref Range   WBC 7.4 4.0 - 10.5 K/uL   RBC 4.42 3.87 - 5.11 MIL/uL   Hemoglobin 9.3 (L) 12.0 - 15.0 g/dL   HCT 47.8 (L) 29.5 - 62.1 %   MCV 74.2 (L) 80.0 - 100.0 fL   MCH 21.0 (L) 26.0 - 34.0 pg   MCHC 28.4 (L) 30.0 - 36.0 g/dL   RDW 30.8 (H) 65.7 - 84.6 %   Platelets 306 150 - 400 K/uL   nRBC 0.3 (H) 0.0 - 0.2 %    Patient Active Problem List   Diagnosis Date Noted   Post term pregnancy over 40 weeks 03/15/2022   Iron deficiency anemia 07/21/2021   Murmur 07/21/2021   Pregnancy 02/23/2017   MIGRAINE VARIANT 09/01/2009   ASTHMA, PERSISTENT 06/30/2006    Assessment/Plan:  Kathleen Morrow is a 26 y.o. G5P1031 at  [redacted]w[redacted]d here for IOL for PD.   #Labor:Plan on initiation of pitocin after patient eats, consider AROM #Pain: Epidural PRN  #FWB: Cat 1 #ID:  GBS neg  #MOF: Formula  #MOC:Depo  #Circ:  yes  Celedonio Savage, MD  03/15/2022, 9:28 AM

## 2022-03-15 NOTE — Progress Notes (Signed)
Patient Vitals for the past 4 hrs:  BP Temp Temp src Pulse Resp SpO2  03/15/22 2035 -- -- -- -- -- 100 %  03/15/22 2030 (!) 98/47 -- -- (!) 109 -- 100 %  03/15/22 2025 -- -- -- -- -- 99 %  03/15/22 2020 -- -- -- -- -- 99 %  03/15/22 2015 (!) 107/55 -- -- 90 -- 99 %  03/15/22 2010 -- -- -- -- -- 99 %  03/15/22 2005 -- -- -- -- -- 98 %  03/15/22 2000 (!) 98/49 -- -- 88 16 --  03/15/22 1959 -- -- -- -- -- 98 %  03/15/22 1955 -- -- -- -- -- 98 %  03/15/22 1949 -- -- -- -- -- 98 %  03/15/22 1945 (!) 99/46 -- -- 89 -- 99 %  03/15/22 1940 (!) 104/47 -- -- 97 -- 99 %  03/15/22 1935 (!) 144/120 -- -- (!) 114 -- --  03/15/22 1930 -- -- -- -- -- 99 %  03/15/22 1900 -- -- -- -- -- 99 %  03/15/22 1853 -- 97.8 F (36.6 C) Oral -- -- --  03/15/22 1830 (!) 90/35 -- -- 69 -- 97 %  03/15/22 1800 (!) 82/58 -- -- 60 -- 99 %  03/15/22 1745 -- -- -- -- -- 98 %  03/15/22 1730 112/77 -- -- -- -- 99 %  03/15/22 1715 -- -- -- -- -- 100 %  03/15/22 1700 -- -- -- -- -- 99 %   Cx still ~ 8.5 on left side. Baby feels LOA.  FHR Cat 1. IUPC placed and MVUs ~ 100.  Will start pitocin.

## 2022-03-15 NOTE — Anesthesia Preprocedure Evaluation (Signed)
Anesthesia Evaluation  Patient identified by MRN, date of birth, ID band Patient awake    Reviewed: Allergy & Precautions, NPO status , Patient's Chart, lab work & pertinent test results  History of Anesthesia Complications Negative for: history of anesthetic complications  Airway Mallampati: I       Dental no notable dental hx.    Pulmonary asthma , former smoker   Pulmonary exam normal        Cardiovascular negative cardio ROS Normal cardiovascular exam     Neuro/Psych  Headaches  negative psych ROS   GI/Hepatic negative GI ROS, Neg liver ROS,,,  Endo/Other  negative endocrine ROS    Renal/GU negative Renal ROS  negative genitourinary   Musculoskeletal negative musculoskeletal ROS (+)    Abdominal Normal abdominal exam  (+)   Peds  Hematology  (+) Blood dyscrasia, anemia  Plt 367k    Anesthesia Other Findings Covid test negative   Reproductive/Obstetrics (+) Pregnancy                             Anesthesia Physical Anesthesia Plan  ASA: II  Anesthesia Plan: Epidural   Post-op Pain Management:    Induction:   PONV Risk Score and Plan: 2  Airway Management Planned:   Additional Equipment:   Intra-op Plan:   Post-operative Plan:   Informed Consent: I have reviewed the patients History and Physical, chart, labs and discussed the procedure including the risks, benefits and alternatives for the proposed anesthesia with the patient or authorized representative who has indicated his/her understanding and acceptance.       Plan Discussed with:   Anesthesia Plan Comments: (Labs reviewed. Platelets acceptable, patient not taking any blood thinning medications. Per RN, FHR tracing reported to be stable enough for sitting procedure. Risks and benefits discussed with patient, including PDPH, backache, epidural hematoma, failed epidural, blood pressure changes, allergic  reaction, and nerve injury. Patient expressed understanding and wished to proceed.)        Anesthesia Quick Evaluation

## 2022-03-16 ENCOUNTER — Encounter (HOSPITAL_COMMUNITY): Payer: Self-pay | Admitting: Obstetrics and Gynecology

## 2022-03-16 LAB — CBC
HCT: 28.2 % — ABNORMAL LOW (ref 36.0–46.0)
Hemoglobin: 8.4 g/dL — ABNORMAL LOW (ref 12.0–15.0)
MCH: 21.4 pg — ABNORMAL LOW (ref 26.0–34.0)
MCHC: 29.8 g/dL — ABNORMAL LOW (ref 30.0–36.0)
MCV: 71.8 fL — ABNORMAL LOW (ref 80.0–100.0)
Platelets: 265 10*3/uL (ref 150–400)
RBC: 3.93 MIL/uL (ref 3.87–5.11)
RDW: 23.1 % — ABNORMAL HIGH (ref 11.5–15.5)
WBC: 20.2 10*3/uL — ABNORMAL HIGH (ref 4.0–10.5)
nRBC: 0 % (ref 0.0–0.2)

## 2022-03-16 MED ORDER — PRENATAL MULTIVITAMIN CH
1.0000 | ORAL_TABLET | Freq: Every day | ORAL | Status: DC
  Administered 2022-03-16 – 2022-03-17 (×2): 1 via ORAL
  Filled 2022-03-16 (×2): qty 1

## 2022-03-16 MED ORDER — ONDANSETRON HCL 4 MG/2ML IJ SOLN: 4.0000 mg | INTRAMUSCULAR | Status: DC | PRN

## 2022-03-16 MED ORDER — IBUPROFEN 600 MG PO TABS
600.0000 mg | ORAL_TABLET | Freq: Four times a day (QID) | ORAL | Status: DC
  Administered 2022-03-16 – 2022-03-17 (×6): 600 mg via ORAL
  Filled 2022-03-16 (×6): qty 1

## 2022-03-16 MED ORDER — WITCH HAZEL-GLYCERIN EX PADS: 1.0000 | MEDICATED_PAD | CUTANEOUS | Status: DC | PRN

## 2022-03-16 MED ORDER — DIPHENHYDRAMINE HCL 25 MG PO CAPS: 25.0000 mg | ORAL_CAPSULE | Freq: Four times a day (QID) | ORAL | Status: DC | PRN

## 2022-03-16 MED ORDER — SIMETHICONE 80 MG PO CHEW: 80.0000 mg | CHEWABLE_TABLET | ORAL | Status: DC | PRN

## 2022-03-16 MED ORDER — BENZOCAINE-MENTHOL 20-0.5 % EX AERO
1.0000 | INHALATION_SPRAY | CUTANEOUS | Status: DC | PRN
  Filled 2022-03-16: qty 56

## 2022-03-16 MED ORDER — COCONUT OIL OIL: 1.0000 | TOPICAL_OIL | Status: DC | PRN

## 2022-03-16 MED ORDER — SENNOSIDES-DOCUSATE SODIUM 8.6-50 MG PO TABS
2.0000 | ORAL_TABLET | Freq: Every day | ORAL | Status: DC
  Administered 2022-03-17: 2 via ORAL
  Filled 2022-03-16: qty 2

## 2022-03-16 MED ORDER — TETANUS-DIPHTH-ACELL PERTUSSIS 5-2.5-18.5 LF-MCG/0.5 IM SUSY: 0.5000 mL | PREFILLED_SYRINGE | Freq: Once | INTRAMUSCULAR | Status: DC

## 2022-03-16 MED ORDER — ZOLPIDEM TARTRATE 5 MG PO TABS: 5.0000 mg | ORAL_TABLET | Freq: Every evening | ORAL | Status: DC | PRN

## 2022-03-16 MED ORDER — ONDANSETRON HCL 4 MG PO TABS: 4.0000 mg | ORAL_TABLET | ORAL | Status: DC | PRN

## 2022-03-16 MED ORDER — FERROUS SULFATE 325 (65 FE) MG PO TABS
325.0000 mg | ORAL_TABLET | Freq: Every day | ORAL | Status: DC
  Administered 2022-03-16 – 2022-03-17 (×2): 325 mg via ORAL
  Filled 2022-03-16 (×2): qty 1

## 2022-03-16 MED ORDER — DIBUCAINE (PERIANAL) 1 % EX OINT: 1.0000 | TOPICAL_OINTMENT | CUTANEOUS | Status: DC | PRN

## 2022-03-16 MED ORDER — ACETAMINOPHEN 325 MG PO TABS
650.0000 mg | ORAL_TABLET | ORAL | Status: DC | PRN
  Administered 2022-03-16 (×2): 650 mg via ORAL
  Filled 2022-03-16 (×2): qty 2

## 2022-03-16 NOTE — Progress Notes (Signed)
When this RN arrived to patient's room to perform patient's 1 hr reassessment, patient was standing by couch with father of baby on couch holding baby. Pt was assisting FOB in positioning baby on his chest for skin to skin. Pt when finished went to return to bed and her right leg buckled under her weight, but pt caught herself and pt did not hit floor or any surrounding furniture. This RN assisted patient back to bed and performed 1 hr vitals and reassessment. Patient reported no further concerns and denied any further needs. Pt stated she would like at least two hours of sleep before re-entry into her room.   Previously to this RN arriving in the room, NT assisted patient to bathroom with a stedy and patient ambulated back to bed.   Sheilah Pigeon RN

## 2022-03-16 NOTE — Lactation Note (Signed)
This note was copied from a baby's chart. Lactation Consultation Note  Patient Name: Kathleen Morrow [id number] Date: 03/16/2022 Reason for consult: Initial assessment Age:24 hours  P2, Mother has been undecided about breastfeeding but RN stated she wanted to try.  Baby latched with ease. Mother cramping and uncomfortable.  RN informed. Encouraged mother to breasteeding before offering formula. Feed on demand with cues.  Goal 8-12+ times per day after first 24 hrs.  Place baby STS if not cueing.  Mom made aware of O/P services, breastfeeding support groups, community resources, and our phone # for post-discharge questions.    Maternal Data Does the patient have breastfeeding experience prior to this delivery?: Yes How long did the patient breastfeed?: 6 weeks  Feeding Mother's Current Feeding Choice: Breast Milk and Formula Nipple Type: Slow - flow  LATCH Score Latch: Grasps breast easily, tongue down, lips flanged, rhythmical sucking.  Audible Swallowing: A few with stimulation  Type of Nipple: Everted at rest and after stimulation  Comfort (Breast/Nipple): Soft / non-tender  Hold (Positioning): Assistance needed to correctly position infant at breast and maintain latch.  LATCH Score: 8   Lactation Tools Discussed/Used    Interventions Interventions: Assisted with latch;Skin to skin;Education  Discharge    Consult Status Consult Status: Follow-up Date: 03/17/22 Follow-up type: In-patient    Dahlia Byes [facility] 03/16/2022, 10:14 AM

## 2022-03-16 NOTE — Progress Notes (Signed)
POSTPARTUM PROGRESS NOTE  Post Partum Day 1  Subjective:  Kathleen Morrow is a 26 y.o. [id number] s/p VD at [redacted]w[redacted]d.  She reports she is doing well. No acute events overnight. She denies any problems with ambulating, voiding or po intake. Denies nausea or vomiting.  Pain is well controlled.  Lochia is minimal.  Objective: Blood pressure 112/63, pulse 76, temperature 97.9 F (36.6 C), temperature source Axillary, resp. rate 16, height 5' (1.524 m), weight 64.2 kg, SpO2 100 %, unknown if currently breastfeeding.  Physical Exam:  General: alert, cooperative and no distress Chest: no respiratory distress Heart:regular rate, distal pulses intact Abdomen: soft, nontender,  Uterine Fundus: firm, appropriately tender DVT Evaluation: No calf swelling or tenderness Extremities: No LE edema Skin: warm, dry  Recent Labs    03/15/22 0848 03/16/22 0558  HGB 9.3* 8.4*  HCT 32.8* 28.2*    Assessment/Plan: Kathleen Morrow is a 26 y.o. [id number] s/p VD at [redacted]w[redacted]d   PPD#1 - Doing well  Routine postpartum care  Contraception: depo Feeding: bottle Dispo: Plan for discharge 11/15.   LOS: 1 day   Lavonda Jumbo, DO OB Fellow, Faculty Practice [facility] 03/16/2022, 9:56 AM

## 2022-03-16 NOTE — Anesthesia Postprocedure Evaluation (Signed)
Anesthesia Post Note  Patient: Kathleen Morrow  Procedure(s) Performed: AN AD HOC LABOR EPIDURAL     Patient location during evaluation: Mother Baby Anesthesia Type: Epidural Level of consciousness: awake and alert Pain management: pain level controlled Vital Signs Assessment: post-procedure vital signs reviewed and stable Respiratory status: spontaneous breathing, nonlabored ventilation and respiratory function stable Cardiovascular status: stable Postop Assessment: no headache, no backache and epidural receding Anesthetic complications: no   No notable events documented.  Last Vitals:  Vitals:   03/16/22 0228 03/16/22 0333  BP: (!) 95/50 113/65  Pulse: 88 94  Resp: 18 18  Temp: 37.3 C 36.7 C  SpO2:  100%    Last Pain:  Vitals:   03/16/22 0333  TempSrc: Oral  PainSc: 1    Pain Goal:                   [name]

## 2022-03-16 NOTE — Lactation Note (Signed)
This note was copied from a baby's chart. Lactation Consultation Note  Patient Name: Boy Caty Tessler [id number] Date: 03/16/2022   Age:24 hours L&D RN stated mom told her the Dr. Talked to her and wanted her to try to BF. Mom told L&D RN that she would like to see Lactation but she doesn't want to be bothered tonight. Wait until in the morning after she has rested.  Maternal Data    Feeding    LATCH Score                    Lactation Tools Discussed/Used    Interventions    Discharge    Consult Status Consult Status: Complete    Alexah Kivett G 03/16/2022, 5:37 AM

## 2022-03-16 NOTE — Clinical Social Work Maternal (Signed)
CLINICAL SOCIAL WORK MATERNAL/CHILD NOTE  Patient Details  Name: Kathleen Morrow MRN: [id number] Date of Birth: October 16, 1995  Date:  03/16/2022  Clinical Social Worker Initiating Note:  Abundio Miu, [place] Date/Time: Initiated:  03/16/22/1211     Child's Name:  Kathleen Morrow   Biological Parents:  Mother, Father (Father: Ivor Costa)   Need for Interpreter:  None   Reason for Referral:  Current Substance Use/Substance Use During Pregnancy  , Other (Comment) Pharmacologist Insecurity)   Address:  [address] [place] [address]    Phone number:  [phone] (home)     Additional phone number:   Household Members/Support Persons (HM/SP):   Household Member/Support Person 1, Household Member/Support Person 2   HM/SP Name Relationship DOB or Age  HM/SP -1 Darrena Strawder mom    HM/SP -2 Trellis Moment son 12/15/19  HM/SP -3        HM/SP -4        HM/SP -5        HM/SP -6        HM/SP -7        HM/SP -8          Natural Supports (not living in the home):  Parent, Spouse/significant other   Professional Supports: None   Employment: Unemployed   Type of Work:     Education:  Programmer, systems   Homebound arranged:    Museum/gallery curator Resources:  [employer]   Other Resources:  [employer], Physicist, medical     Cultural/Religious Considerations Which May Impact Care:    Strengths:  Ability to meet basic needs  , Engineer, materials, Home prepared for child     Psychotropic Medications:         Pediatrician:    [place] area  Pediatrician List:   [place]  [place]      Pediatrician Fax Number:    Risk Factors/Current Problems:  Mental Health Concerns  , Substance Use     Cognitive State:  Alert  , Able to Concentrate  , Goal Oriented  , Linear Thinking     Mood/Affect:  Calm  , Interested  , Apprehensive  , Comfortable     CSW  Assessment: CSW met with MOB at bedside to complete psychosocial assessment. CSW introduced self and explained reason for consult. MOB presented apprehensive initially and as assessment continued MOB became more engaged and relaxed. MOB reported that she resides with her mother and older son. MOB reported that she receives both [facility] and food stamps. MOB reported having all items needed to care for infant including a car seat, basinet, and crib. CSW inquired about MOB's support system, MOB reported that her mom and FOB are supports. CSW inquired about food insecurity noted on MOB's chart, MOB denied any concerns obtaining for herself and her family. MOB denied needing any resources for food and shared that she has food stamps.   CSW inquired about MOB's mental health history. MOB denied any mental health history. CSW inquired about any postpartum depression history. MOB endorsed having "a little bit" of postpartum depression after having her oldest son. MOB attributed her postpartum depression to being a first time mom and transitioning into motherhood. MOB shared that she had a good support system and her postpartum depression lasted about a month. MOB shared that returning to work and having a  routine helped her postpartum depression. CSW inquired about how MOB was feeling emotionally since giving birth, MOB reported that she was feeling good. MOB shared that she was no longer in pain, noting her pregnancy was a struggle. MOB presented calm and did not demonstrate any acute mental health signs/symptoms. CSW assessed for safety, MOB denied SI, HI, and domestic violence.   CSW provided education regarding the baby blues period vs. perinatal mood disorders, discussed treatment and gave resources for mental health follow up if concerns arise.  CSW recommends self-evaluation during the postpartum time period using the New Mom Checklist from Postpartum Progress and encouraged MOB to contact a medical professional if  symptoms are noted at any time.    CSW provided review of Sudden Infant Death Syndrome (SIDS) precautions.    CSW informed MOB about the hospital drug screen policy due to documented substance use during pregnancy. MOB confirmed smoking a little marijuana in the beginning of pregnancy due to nausea. MOB denied any additional substance use during pregnancy. CSW informed MOB that infant's UDS and CDS would be monitored and a CPS report would be made if warranted. MOB verbalized understanding and denied any questions.   CSW identifies no further need for intervention and no barriers to discharge at this time.   CSW Plan/Description:  Sudden Infant Death Syndrome (SIDS) Education, Perinatal Mood and Anxiety Disorder (PMADs) Education, No Further Intervention Required/No Barriers to Discharge, [place], CSW Will Continue to Monitor Umbilical Cord Tissue Drug Screen Results and Make Report if Barbette Or, LCSW 03/16/2022, 12:13 PM

## 2022-03-16 NOTE — Discharge Summary (Signed)
Postpartum Discharge Summary     Patient Name: Kathleen Morrow DOB: 1995-05-23 MRN: [id number]  Date of admission: 03/15/2022 Delivery date:03/15/2022  Delivering provider: Christin Fudge  Date of discharge: 03/17/2022  Admitting diagnosis: Post term pregnancy over 40 weeks [O48.0] Intrauterine pregnancy: [redacted]w[redacted]d    Secondary diagnosis:  Principal Problem:   Post term pregnancy over 40 weeks  Additional problems: anemia of pregnancy    Discharge diagnosis: Term Pregnancy Delivered                                              Post partum procedures: n/a Augmentation: Pitocin, Cytotec, and IP Foley Complications: None  Hospital course: Induction of Labor With Vaginal Delivery   26y.o. yo [id number] [id number] admitted to the hospital 03/15/2022 for induction of labor.  Indication for induction: Postdates.  Patient had an uncomplicated labor course. Membrane Rupture Time/Date: 12:36 PM ,03/15/2022   Delivery Method:Vaginal, Spontaneous  Episiotomy: None  Lacerations:  Periurethral  Details of delivery can be found in separate delivery note.  Patient had an uncomplicated postpartum course. Patient is discharged home 03/17/22.  Newborn Data: Birth date:03/15/2022  Birth time:11:58 PM  Gender:Female  Living status:Living  Apgars:8 ,9  Weight:3830 g   Magnesium Sulfate received: No BMZ received: No Rhophylac:N/A MMR:N/A T-DaP:Given prenatally Flu: Yes Transfusion:No  Physical exam  Vitals:   03/16/22 1150 03/16/22 1554 03/16/22 2303 03/17/22 0500  BP: 94/60 (!) 105/48 110/69 103/76  Pulse: 72 78 74 (!) 58  Resp: _0 Temp: 98 F (36.7 C) 98.1 F (36.7 C) 97.7 F (36.5 C) 97.7 F (36.5 C)  TempSrc: Oral Oral Oral   SpO2: 100% 100%    Weight:      Height:       General: alert, cooperative, and no distress Lochia: appropriate Uterine Fundus: firm Incision: N/A DVT Evaluation: No evidence of DVT seen on physical exam. Labs: Lab Results   Component Value Date   WBC 20.2 (H) 03/16/2022   HGB 8.4 (L) 03/16/2022   HCT 28.2 (L) 03/16/2022   MCV 71.8 (L) 03/16/2022   PLT 265 03/16/2022      Latest Ref Rng & Units 03/07/2022   11:10 PM  CMP  Glucose 70 - 99 mg/dL 85   BUN 6 - 20 mg/dL <5   Creatinine 0.44 - 1.00 mg/dL 0.56   Sodium 135 - 145 mmol/L 138   Potassium 3.5 - 5.1 mmol/L 3.3   Chloride 98 - 111 mmol/L 106   CO2 22 - 32 mmol/L 21   Calcium 8.9 - 10.3 mg/dL 8.4   Total Protein 6.5 - 8.1 g/dL 5.9   Total Bilirubin 0.3 - 1.2 mg/dL 1.3   Alkaline Phos 38 - 126 U/L 142   AST 15 - 41 U/L 17   ALT 0 - 44 U/L 11    Edinburgh Score:    02/08/2020    2:15 PM  Edinburgh Postnatal Depression Scale Screening Tool  I have been able to laugh and see the funny side of things. 3  I have looked forward with enjoyment to things. 3  I have blamed myself unnecessarily when things went wrong. 0  I have been anxious or worried for no good reason. 0  I have felt scared or panicky for no good reason. 0  Things have been  getting on top of me. 1  I have been so unhappy that I have had difficulty sleeping. 0  I have felt sad or miserable. 0  I have been so unhappy that I have been crying. 0  The thought of harming myself has occurred to me. 0  Edinburgh Postnatal Depression Scale Total 7     After visit meds:  Allergies as of 03/17/2022       Reactions   Shellfish Allergy Anaphylaxis, Hives, Swelling   Facial swelling   Bee Pollen    Pollen Extract         Medication List     STOP taking these medications    pantoprazole 40 MG tablet Commonly known as: PROTONIX   polyethylene glycol powder 17 GM/SCOOP powder Commonly known as: MiraLax       TAKE these medications    acetaminophen 325 MG tablet Commonly known as: Tylenol Take 2 tablets (650 mg total) by mouth every 4 (four) hours as needed (for pain scale < 4).   famotidine 20 MG tablet Commonly known as: PEPCID Take 1 tablet (20 mg total) by mouth  at bedtime.   ferrous sulfate 325 (65 FE) MG tablet Take 1 tablet (325 mg total) by mouth every other day.   ibuprofen 600 MG tablet Commonly known as: ADVIL Take 1 tablet (600 mg total) by mouth every 6 (six) hours as needed.   PRENATAL PO Take 1 tablet by mouth daily.         Discharge home in stable condition Infant Feeding: Bottle and Breast Infant Disposition:home with mother Discharge instruction: per After Visit Summary and Postpartum booklet. Activity: Advance as tolerated. Pelvic rest for 6 weeks.  Diet: routine diet Future Appointments: Future Appointments  Date Time Provider [place]  04/21/2022 10:10 AM Eppie Gibson, MD [facility] [place]   Follow up Visit:   Please schedule this patient for a Virtual postpartum visit in 4 weeks with the following provider: Any provider. Additional Postpartum F/U:  Low risk pregnancy complicated by:  Delivery mode:  Vaginal, Spontaneous  Anticipated Birth Control:  Depo   03/17/2022 Naaman Plummer [place], DO

## 2022-03-16 NOTE — Lactation Note (Signed)
This note was copied from a baby's chart. Lactation Consultation Note  Patient Name: Kathleen Morrow [id number] Date: 03/16/2022   Age:24 hours Mom's feeding choice is formula feeding.  Maternal Data    Feeding    LATCH Score                    Lactation Tools Discussed/Used    Interventions    Discharge    Consult Status Consult Status: Complete    Samah Lapiana G 03/16/2022, 2:20 AM

## 2022-03-17 ENCOUNTER — Inpatient Hospital Stay (HOSPITAL_COMMUNITY): Payer: Medicaid Other

## 2022-03-17 MED ORDER — ACETAMINOPHEN 325 MG PO TABS: 650.0000 mg | ORAL_TABLET | ORAL | 0 refills | Status: AC | PRN

## 2022-03-17 MED ORDER — IBUPROFEN 600 MG PO TABS: 600.0000 mg | ORAL_TABLET | Freq: Four times a day (QID) | ORAL | 0 refills | Status: DC | PRN

## 2022-03-17 MED ORDER — MEDROXYPROGESTERONE ACETATE 150 MG/ML IM SUSP
150.0000 mg | Freq: Once | INTRAMUSCULAR | Status: AC
  Administered 2022-03-17: 150 mg via INTRAMUSCULAR
  Filled 2022-03-17: qty 1

## 2022-03-17 NOTE — Lactation Note (Signed)
This note was copied from a baby's chart. Lactation Consultation Note  Patient Name: Kathleen Morrow [id number] Date: 03/17/2022 Reason for consult: Follow-up assessment;Term;Infant weight loss (per Mom he has been to the breast a few times,but i plans to only bottle at home. Bbay awake and hungry , LC provided the bottle and nipple . LC reviewed what to do to dry up the milk if it comes in.) Age:24 hours  Maternal Data    Feeding Mother's Current Feeding Choice: Formula Nipple Type: Slow - flow  /Used    Interventions Interventions: Breast feeding basics reviewed;Education  Discharge Discharge Education: Engorgement and breast care  Consult Status Consult Status: Complete Date: 03/17/22    Matilde Sprang Jesilyn Easom 03/17/2022, 9:09 AM

## 2022-03-23 ENCOUNTER — Telehealth (HOSPITAL_COMMUNITY): Payer: Self-pay

## 2022-03-23 NOTE — Telephone Encounter (Signed)
Patient did not answer phone call. Voicemail left for patient.   Kathleen Morrow [facility] 03/23/2022,1427

## 2022-04-21 ENCOUNTER — Encounter: Payer: Self-pay | Admitting: Student

## 2022-06-25 ENCOUNTER — Ambulatory Visit: Payer: Medicaid Other

## 2022-06-30 ENCOUNTER — Encounter: Payer: Self-pay | Admitting: Student

## 2022-06-30 ENCOUNTER — Ambulatory Visit (INDEPENDENT_AMBULATORY_CARE_PROVIDER_SITE_OTHER): Payer: Medicaid Other | Admitting: Student

## 2022-06-30 VITALS — BP 110/58 | HR 62 | Ht 60.0 in | Wt 123.5 lb

## 2022-06-30 DIAGNOSIS — Z3009 Encounter for other general counseling and advice on contraception: Secondary | ICD-10-CM

## 2022-06-30 DIAGNOSIS — Z3200 Encounter for pregnancy test, result unknown: Secondary | ICD-10-CM | POA: Insufficient documentation

## 2022-06-30 DIAGNOSIS — Z309 Encounter for contraceptive management, unspecified: Secondary | ICD-10-CM | POA: Insufficient documentation

## 2022-06-30 HISTORY — DX: Encounter for other general counseling and advice on contraception: Z30.09

## 2022-06-30 HISTORY — DX: Encounter for pregnancy test, result unknown: Z32.00

## 2022-06-30 LAB — POCT URINE PREGNANCY: Preg Test, Ur: NEGATIVE

## 2022-06-30 MED ORDER — MEDROXYPROGESTERONE ACETATE 150 MG/ML IM SUSP
150.0000 mg | Freq: Once | INTRAMUSCULAR | Status: AC
  Administered 2022-06-30: 150 mg via INTRAMUSCULAR

## 2022-06-30 NOTE — Patient Instructions (Addendum)
It was great to see you today! Thank you for choosing [facility] for your primary care. Kathleen Morrow was seen for follow up.  Today we addressed: Continue with Depo today We will order a rx for pills in 2 mnths, please call or message  Use miralax daily until you have 2 soft stools a day   If you haven't already, sign up for My Chart to have easy access to your labs results, and communication with your primary care physician.  I recommend that you always bring your medications to each appointment as this makes it easy to ensure you are on the correct medications and helps [place] not miss refills when you need them. Call the clinic at [phone] if your symptoms worsen or you have any concerns.  You should return to our clinic Return in about 3 months (around 09/28/2022) for CALL. Please arrive 15 minutes before your appointment to ensure smooth check in process.  We appreciate your efforts in making this happen.  Thank you for allowing me to participate in your care, Erskine Emery, MD 06/30/2022, 11:30 AM PGY-2, [place]

## 2022-06-30 NOTE — Progress Notes (Signed)
  SUBJECTIVE:   CHIEF COMPLAINT / HPI:   Wants to change BC from depo to OCPs. Does not like weight changes from depo. H/o migraine variant but without aura, no clotting disorders, HTN, Elevated BMI. Otherwise well. Due for next depo. Would not like STI testing.   Has mild lower abdominal pain intermittently that resolves generally once her constipation resolves with Miralax.   PERTINENT  PMH / PSH:   Past Medical History:  Diagnosis Date   Anemia    Asthma    BEHAVIOR PROBLEM 09/25/2008   Qualifier: Diagnosis of  By: Carmie End MD, [place]     Encounter for vaginal delivery 12/15/2019   Prenatal care in third trimester 10/23/2019    OBJECTIVE:  BP (!) 110/58   Pulse 62   Ht 5' (1.524 m)   Wt 123 lb 8 oz (56 kg)   LMP 05/24/2022   SpO2 100%   BMI 24.12 kg/m  Physical Exam  General: Alert and oriented in no apparent distress Heart: Regular rate and rhythm with no murmurs appreciated Lungs: CTA bilaterally, no wheezing Abdomen: Bowel sounds present, no abdominal pain Skin: Warm and dry Extremities: No lower extremity edema   ASSESSMENT/PLAN:  Possible pregnancy Assessment & Plan: Upreg negative. UA ordered at request of patient given abdominal pain although seemingly related to constipation. No systemic symptoms   Orders: -     POCT urine pregnancy  Birth control counseling Assessment & Plan: Decided to move forward with Depo one more time and then transition to OCPs (not absolute contraindications). Instructed patient to call in a couple of months for the rx.   Orders: -     Urinalysis -     medroxyPROGESTERone Acetate  Continue Miralax until patient has 2 soft stools a day.   Return in about 3 months (around 09/28/2022) for CALL. Erskine Emery, MD 06/30/2022, 5:47 PM PGY-2, [place]

## 2022-06-30 NOTE — Assessment & Plan Note (Signed)
Decided to move forward with Depo one more time and then transition to OCPs (not absolute contraindications). Instructed patient to call in a couple of months for the rx.

## 2022-06-30 NOTE — Assessment & Plan Note (Signed)
Upreg negative. UA ordered at request of patient given abdominal pain although seemingly related to constipation. No systemic symptoms

## 2022-07-01 LAB — URINALYSIS
Bilirubin, UA: NEGATIVE
Glucose, UA: NEGATIVE
Ketones, UA: NEGATIVE
Leukocytes,UA: NEGATIVE
Nitrite, UA: NEGATIVE
Protein,UA: NEGATIVE
RBC, UA: NEGATIVE
Specific Gravity, UA: 1.018 (ref 1.005–1.030)
Urobilinogen, Ur: 0.2 mg/dL (ref 0.2–1.0)
pH, UA: 7.5 (ref 5.0–7.5)

## 2022-07-02 ENCOUNTER — Encounter: Payer: Self-pay | Admitting: Student

## 2022-11-08 ENCOUNTER — Ambulatory Visit: Payer: Medicaid Other | Admitting: Family Medicine

## 2022-12-13 ENCOUNTER — Encounter: Payer: Self-pay | Admitting: Family Medicine

## 2022-12-14 ENCOUNTER — Ambulatory Visit: Payer: Medicaid Other | Admitting: Family Medicine

## 2022-12-14 ENCOUNTER — Encounter: Payer: Self-pay | Admitting: Family Medicine

## 2022-12-14 VITALS — BP 101/56 | HR 60 | Ht 60.0 in | Wt 119.0 lb

## 2022-12-14 DIAGNOSIS — Z309 Encounter for contraceptive management, unspecified: Secondary | ICD-10-CM

## 2022-12-14 DIAGNOSIS — R109 Unspecified abdominal pain: Secondary | ICD-10-CM | POA: Insufficient documentation

## 2022-12-14 DIAGNOSIS — D509 Iron deficiency anemia, unspecified: Secondary | ICD-10-CM | POA: Diagnosis not present

## 2022-12-14 DIAGNOSIS — R10A0 Flank pain, unspecified side: Secondary | ICD-10-CM | POA: Insufficient documentation

## 2022-12-14 DIAGNOSIS — Z23 Encounter for immunization: Secondary | ICD-10-CM

## 2022-12-14 LAB — POCT URINE PREGNANCY: Preg Test, Ur: NEGATIVE

## 2022-12-14 MED ORDER — MEDROXYPROGESTERONE ACETATE 150 MG/ML IM SUSP
150.0000 mg | Freq: Once | INTRAMUSCULAR | Status: AC
  Administered 2022-12-14: 150 mg via INTRAMUSCULAR

## 2022-12-14 MED ORDER — IBUPROFEN 400 MG PO TABS: 400.0000 mg | ORAL_TABLET | Freq: Three times a day (TID) | ORAL | 0 refills | Status: DC | PRN

## 2022-12-14 MED ORDER — FERROUS SULFATE 325 (65 FE) MG PO TABS: 325.0000 mg | ORAL_TABLET | ORAL | 3 refills | Status: AC

## 2022-12-14 NOTE — Assessment & Plan Note (Signed)
She declined lab work today Resume Iron supplements F?U for lab when able to

## 2022-12-14 NOTE — Assessment & Plan Note (Signed)
Although on her period currently, upreg checked due to irregularity she described - amenorrhea for months, not breastfeeding. Upreg is negative Depo provera shot given Use backup protection for 5-7 days F/U per schedule Return in Nov for PAP and STD screening

## 2022-12-14 NOTE — Assessment & Plan Note (Signed)
May be related to her menstruation Differentials include kidney stones. UTI is unlikely given negative UTI symptoms She is currently on her period.Hence poor utility for UA to assess for Kidney stones. Increase hydration encouraged Ibuprofen 400 mg prn escribed F/U soon if symptoms persists She agreed with the plan

## 2022-12-14 NOTE — Patient Instructions (Signed)

## 2022-12-14 NOTE — Progress Notes (Signed)
    SUBJECTIVE:   CHIEF COMPLAINT / HPI:   Anemia:  She is here for the follow-up. She denies any acute concerns related to her anemia. She does not take iron supplements. Her periods can get heavy. She is on her period now.  Flank pain: She c/o left sided flank pain since she started her period 3 days ago. Pain is aching in nature and was 9/10 in severity a few days ago. She used OTC Advil with some improvement. She denies dysuria.  Contraceptive management: She wants to get back on Depo-provera. LMP 12/11/22 (first period seen in months). Period is heavy with stomach cramping and low back pain. Sexually active, and her last unprotected sexual activity was 1 - 2 week ago.    PERTINENT  PMH / PSH: PMHx reviewed  OBJECTIVE:   BP (!) 101/56   Pulse 60   Ht 5' (1.524 m)   Wt 119 lb (54 kg)   LMP 12/14/2022   SpO2 98%   BMI 23.24 kg/m   Physical Exam Vitals and nursing note reviewed.  Cardiovascular:     Rate and Rhythm: Normal rate and regular rhythm.     Heart sounds: Normal heart sounds. No murmur heard. Pulmonary:     Effort: Pulmonary effort is normal. No respiratory distress.     Breath sounds: Normal breath sounds. No wheezing.  Abdominal:     General: Abdomen is flat. Bowel sounds are normal. There is no distension.     Palpations: Abdomen is soft. There is no mass.     Tenderness: There is no abdominal tenderness. There is no right CVA tenderness, left CVA tenderness or rebound.      ASSESSMENT/PLAN:   Iron deficiency anemia She declined lab work today Resume Iron supplements F?U for lab when able to  Flank pain May be related to her menstruation Differentials include kidney stones. UTI is unlikely given negative UTI symptoms She is currently on her period.Hence poor utility for UA to assess for Kidney stones. Increase hydration encouraged Ibuprofen 400 mg prn escribed F/U soon if symptoms persists She agreed with the plan  Contraception  management Although on her period currently, upreg checked due to irregularity she described - amenorrhea for months, not breastfeeding. Upreg is negative Depo provera shot given Use backup protection for 5-7 days F/U per schedule Return in Nov for PAP and STD screening     Janit Pagan, MD [facility]

## 2023-02-09 ENCOUNTER — Ambulatory Visit (INDEPENDENT_AMBULATORY_CARE_PROVIDER_SITE_OTHER): Payer: Medicaid Other | Admitting: Family Medicine

## 2023-02-09 NOTE — Progress Notes (Signed)
No show for same day clinic.

## 2023-02-09 NOTE — Patient Instructions (Signed)
It was wonderful to see you today.  Please bring ALL of your medications with you to every visit.   Today we talked about: Please follow up for your Pap in November  I will message you with blood work results   Thank you for choosing [facility].   Please call [phone] with any questions about today's appointment.  Please be sure to schedule follow up at the front  desk before you leave today.   Terisa Starr, MD  Family Medicine

## 2023-02-17 ENCOUNTER — Other Ambulatory Visit: Payer: Self-pay | Admitting: Family Medicine

## 2023-02-17 ENCOUNTER — Encounter: Payer: Self-pay | Admitting: Family Medicine

## 2023-02-17 MED ORDER — IBUPROFEN 400 MG PO TABS: 400.0000 mg | ORAL_TABLET | Freq: Three times a day (TID) | ORAL | 0 refills | Status: DC | PRN

## 2023-03-22 ENCOUNTER — Encounter: Payer: Medicaid Other | Admitting: Family Medicine

## 2023-03-25 ENCOUNTER — Other Ambulatory Visit (HOSPITAL_COMMUNITY)
Admission: RE | Admit: 2023-03-25 | Discharge: 2023-03-25 | Disposition: A | Discharge status: Home / Self Care | Payer: BC Managed Care – PPO | Source: Ambulatory Visit | Attending: Family Medicine | Admitting: Family Medicine

## 2023-03-25 ENCOUNTER — Encounter: Payer: Self-pay | Admitting: Family Medicine

## 2023-03-25 ENCOUNTER — Ambulatory Visit (INDEPENDENT_AMBULATORY_CARE_PROVIDER_SITE_OTHER): Payer: Medicaid Other | Admitting: Family Medicine

## 2023-03-25 VITALS — BP 118/56 | HR 63 | Ht 60.0 in | Wt 116.6 lb

## 2023-03-25 DIAGNOSIS — Z309 Encounter for contraceptive management, unspecified: Secondary | ICD-10-CM

## 2023-03-25 DIAGNOSIS — Z23 Encounter for immunization: Secondary | ICD-10-CM

## 2023-03-25 DIAGNOSIS — Z113 Encounter for screening for infections with a predominantly sexual mode of transmission: Secondary | ICD-10-CM | POA: Diagnosis not present

## 2023-03-25 DIAGNOSIS — Z124 Encounter for screening for malignant neoplasm of cervix: Secondary | ICD-10-CM | POA: Insufficient documentation

## 2023-03-25 DIAGNOSIS — Z01419 Encounter for gynecological examination (general) (routine) without abnormal findings: Secondary | ICD-10-CM

## 2023-03-25 DIAGNOSIS — D509 Iron deficiency anemia, unspecified: Secondary | ICD-10-CM

## 2023-03-25 LAB — POCT URINE PREGNANCY: Preg Test, Ur: NEGATIVE

## 2023-03-25 MED ORDER — MEDROXYPROGESTERONE ACETATE 150 MG/ML IM SUSY
150.0000 mg | PREFILLED_SYRINGE | Freq: Once | INTRAMUSCULAR | Status: AC
Start: 2023-03-25 — End: 2023-03-25
  Administered 2023-03-25: 150 mg via INTRAMUSCULAR

## 2023-03-25 NOTE — Assessment & Plan Note (Signed)
OCP options discussed She later opted to restart her Depo I discussed risk of pregnancy with her given recent unprotected sex, although this is low as she is just getting off her period Option provided to return for repeat Upreg in 2 weeks and use condoms in the meantime.  Despite this she opted to get Depo shot today and recheck Upreg with me or at home in 2-4 weeks Depo given per her request

## 2023-03-25 NOTE — Patient Instructions (Signed)
HPV Vaccine Information for Parents  Human papillomavirus (HPV) is a common virus. It spreads easily from person to person through skin-to-skin or sexual contact. There are many types of HPV viruses. Genital or mucosal HPV can cause warts in the genitals. Cutaneous or nonmucosal HPV can cause warts on the hands or feet. Some genital HPV types may cause cancer. Your child can get a shot to help prevent the HPV types that can cause cancer, genital warts, or warts near the opening of the butt (anus). The vaccine is safe and effective. It is recommended that your child get the vaccine at about 3-25 years of age. Getting the vaccine before your child is sexually active gives them the best protection from HPV through adulthood. How can HPV affect my child? An infection with HPV can cause: Genital warts. Mouth or throat cancer. Cancer of the anus. Cancer of the lowest part of the uterus (cervix), outer female genital area (vulva), or vagina. Cancer of the penis (penile cancer). During pregnancy, HPV can be passed to the baby. This infection can cause warts to form in the baby's throat and mouth. What actions can I take to lower my child's risk for HPV? Have your child get the HPV vaccine before they become sexually active. The best time to get the shot is at around 25-25 years of age. The vaccine may be given to children as young as 25 years old.  If your child gets the vaccine before they are 25 years old, it can be given as 2 shots, 6-12 months apart. In some cases, 3 doses are needed. Your child may need 3 doses if: They get the first dose before they are 25 years old but do not have a second dose within 6-12 months after the first dose. They get their first dose after they are 25 years old. They will need to get the other 2 doses within 6 months of the first dose. They have a weak body defense system (immune system). What are the risks and benefits of the HPV vaccine? Benefits Getting the vaccine  can help prevent certain cancers. These include: Oral cancer. This is cancer of the mouth. Anal cancer. This is cancer of the anus. Cancers of the cervix, vulva, and vagina in females. Penile cancer in males. Your child is less likely to get these cancers if they get the vaccine before they become sexually active. The vaccine also prevents genital warts caused by HPV. Risks In rare cases, side effects and reactions have been reported. These include: Soreness, redness, or swelling at the injection site. Dizziness or fainting. Fever. Headache. Muscle or joint pain. Who should not get the HPV vaccine or wait to get it? Some children should not get the HPV vaccine or should wait to get it. Ask the health care provider if your child should get the vaccine if: Your child has had a severe allergic reaction to other vaccines. Your child is allergic to yeast. Your child has a fever. Your child has had a recent illness. Your child is pregnant or may be pregnant. Where to find more information Centers for Disease Control and Prevention (CDC): TonerPromos.no American Academy of Pediatrics (AAP): healthychildren.org This information is not intended to replace advice given to you by your health care provider. Make sure you discuss any questions you have with your health care provider. Document Revised: 01/15/2022 Document Reviewed: 01/15/2022 Elsevier Patient Education  2024 [employer].

## 2023-03-25 NOTE — Progress Notes (Signed)
    SUBJECTIVE:   CHIEF COMPLAINT / HPI:   Gyn exam/Contraceptive management: Here for PAP. No GU symptoms today. Sexually active with the same partner. Does not use protection regularly. The last sexual activity was a few days ago. Her LMP was 03/21/23. Missed her Depo shot on 03/12/23. She feels like the Depo shot dysregulated her period and wanted to talk about other options. She is spotting a little bit of scanty brownish blood and feels like her period is waning off.   Anemia: Asymptomatic. Here for follow-up.  PERTINENT  PMH / PSH: PMHx reviewed  OBJECTIVE:   BP (!) 118/56   Pulse 63   Ht 5' (1.524 m)   Wt 116 lb 9.6 oz (52.9 kg)   SpO2 100%   BMI 22.77 kg/m   Physical Exam Vitals and nursing note reviewed. Exam conducted with a chaperone present (Deseree Blount).  Cardiovascular:     Rate and Rhythm: Normal rate and regular rhythm.     Heart sounds: Normal heart sounds. No murmur heard. Pulmonary:     Effort: Pulmonary effort is normal. No respiratory distress.     Breath sounds: Normal breath sounds. No wheezing.  Abdominal:     General: Abdomen is flat. There is no distension.     Palpations: Abdomen is soft.     Tenderness: There is no abdominal tenderness.  Genitourinary:    General: Normal vulva.     Exam position: Lithotomy position.     Vagina: Normal.     Cervix: Normal.     Comments: Scanty brownish blood from her cervical os. No abnormal discharge     ASSESSMENT/PLAN:   Iron deficiency anemia Lab test recommended - [facility]'d [facility] as we don't have a phlebotomist onsite She prefers to reschedule future appointment for lab   Contraception management OCP options discussed She later opted to restart her Depo I discussed risk of pregnancy with her given recent unprotected sex, although this is low as she is just getting off her period Option provided to return for repeat Upreg in 2 weeks and use condoms in the meantime.   Despite this she opted to get Depo shot today and recheck Upreg with me or at home in 2-4 weeks Depo given per her request   Gyn exam: PAP completed with GC/Chlamydia She agreed to STD testing I will call her with her test results  She declined COVID and flu shots HPV given today  Janit Pagan, MD [facility]

## 2023-03-25 NOTE — Assessment & Plan Note (Signed)
Lab test recommended - she'd need to go [facility] for this as we don't have a phlebotomist onsite She prefers to reschedule future appointment for lab

## 2023-03-29 LAB — CYTOLOGY - PAP
Chlamydia: NEGATIVE
Comment: NEGATIVE
Comment: NEGATIVE
Comment: NORMAL
Diagnosis: NEGATIVE
Neisseria Gonorrhea: NEGATIVE
Trichomonas: NEGATIVE

## 2023-04-05 ENCOUNTER — Ambulatory Visit: Payer: BC Managed Care – PPO | Admitting: Family Medicine

## 2023-04-11 ENCOUNTER — Ambulatory Visit (INDEPENDENT_AMBULATORY_CARE_PROVIDER_SITE_OTHER): Payer: BC Managed Care – PPO | Admitting: Student

## 2023-04-11 ENCOUNTER — Encounter: Payer: Self-pay | Admitting: Student

## 2023-04-11 VITALS — BP 110/56 | HR 74 | Temp 98.5°F | Ht 61.0 in | Wt 116.4 lb

## 2023-04-11 DIAGNOSIS — Z111 Encounter for screening for respiratory tuberculosis: Secondary | ICD-10-CM | POA: Diagnosis not present

## 2023-04-11 NOTE — Progress Notes (Unsigned)
    SUBJECTIVE:   CHIEF COMPLAINT / HPI:   27 year old female with PMH significant for asthma/migraine Presenting today for work physical She is doing well today overall Recently gained employment working as cool for [employer] Denies use of tobacco, alcohol or any illicit drugs Denied any chest pain, vision changes or shortness of breath Really able to walk 5 blocks without problem No family history of sudden death or heart attack Generally healthy and no medical concerns    PERTINENT  PMH / PSH: Reviewed   OBJECTIVE:   BP (!) 110/56   Pulse 74   Temp 98.5 F (36.9 C)   Ht 5\' 1"  (1.549 m)   Wt 116 lb 6.4 oz (52.8 kg)   SpO2 98%   BMI 21.99 kg/m    Physical Exam General: Alert, well appearing, NAD Cardiovascular: RRR, No Murmurs, Normal S2/S2 Respiratory: CTAB, No wheezing or Rales Abdomen: Soft, no distension or tenderness Extremities: No edema on extremities, Normal gait   Skin: Warm and dry Neuro: Cranial nerves II to XII intact, no focal deficits.  ASSESSMENT/PLAN:   No problem-specific Assessment & Plan notes found for this encounter.   TB screening No recent travel and patient has low risk for TB. Per request from work as part of employment screening patient is required to complete a TB test. Patient expressed desire for Manley Mason test. -Order Quant gold test for TB  Health assessment Generally healthy 27 year old female with no medical concern. Physical exam was normal. -Patient will send form to be completed once she downloads it from her company portal   Jerre Simon, MD [facility]

## 2023-04-11 NOTE — Patient Instructions (Signed)
Nice to meet you.  Today we are ordering labs to test you for TB.  Your results should come back in the next 2-4 days.  Make sure to return your form for completion.

## 2023-04-13 LAB — QUANTIFERON-TB GOLD PLUS
QuantiFERON Mitogen Value: 9.92 [IU]/mL
QuantiFERON Nil Value: 0.03 [IU]/mL
QuantiFERON TB1 Ag Value: 0.04 [IU]/mL
QuantiFERON TB2 Ag Value: 0.05 [IU]/mL
QuantiFERON-TB Gold Plus: NEGATIVE

## 2023-04-14 ENCOUNTER — Encounter: Payer: Self-pay | Admitting: Student

## 2023-04-15 ENCOUNTER — Encounter: Payer: Self-pay | Admitting: Family Medicine

## 2023-04-15 ENCOUNTER — Encounter: Payer: Self-pay | Admitting: Student

## 2023-06-08 ENCOUNTER — Telehealth: Payer: Self-pay | Admitting: Family Medicine

## 2023-06-08 NOTE — Telephone Encounter (Signed)
 Patient's phone number is invalid. I will send a letter regarding our clinic no-show policy as she has no-showed to multiple appointments.

## 2023-09-02 ENCOUNTER — Ambulatory Visit (INDEPENDENT_AMBULATORY_CARE_PROVIDER_SITE_OTHER): Admitting: Student

## 2023-09-02 ENCOUNTER — Encounter: Payer: Self-pay | Admitting: Student

## 2023-09-02 VITALS — BP 105/58 | HR 66 | Ht 61.0 in | Wt 115.4 lb

## 2023-09-02 DIAGNOSIS — L249 Irritant contact dermatitis, unspecified cause: Secondary | ICD-10-CM

## 2023-09-02 DIAGNOSIS — Z309 Encounter for contraceptive management, unspecified: Secondary | ICD-10-CM

## 2023-09-02 LAB — POCT URINE PREGNANCY: Preg Test, Ur: NEGATIVE

## 2023-09-02 MED ORDER — DIPHENHYDRAMINE HCL 50 MG PO TABS: 50.0000 mg | ORAL_TABLET | Freq: Every evening | ORAL | 0 refills | Status: AC | PRN

## 2023-09-02 MED ORDER — HYDROCORTISONE 2.5 % EX OINT: TOPICAL_OINTMENT | Freq: Two times a day (BID) | CUTANEOUS | 3 refills | Status: AC

## 2023-09-02 MED ORDER — MEDROXYPROGESTERONE ACETATE 150 MG/ML IM SUSP
150.0000 mg | Freq: Once | INTRAMUSCULAR | Status: AC
Start: 2023-09-02 — End: 2023-09-02
  Administered 2023-09-02: 150 mg via INTRAMUSCULAR

## 2023-09-02 MED ORDER — FAMOTIDINE 20 MG PO TABS: 20.0000 mg | ORAL_TABLET | Freq: Every day | ORAL | 1 refills | Status: AC

## 2023-09-02 NOTE — Assessment & Plan Note (Signed)
 Pregnancy test obtained today was negative.  Patient received her Depo-Provera  shots today and advised her next shot to be between July 18-August 1.  Noted LMP 1 week ago however patient had unprotected sex yesterday.  Patient advised to complete home pregnancy test in 2 weeks.

## 2023-09-02 NOTE — Patient Instructions (Addendum)
 Pleasure to meet you today.  Today your pregnancy test was negative.  We gave you your Depo shots today and the next due date is between July 18 to August 1.  Suspect the rashes on your skin are most likely allergic reaction.  I have sent in prescription for Benadryl  and Pepcid .  Also I have sent an ointment hydrocortisone cream which you can apply on the affected areas.  Avoid using the cream on your face or your pelvic area.

## 2023-09-02 NOTE — Progress Notes (Addendum)
    SUBJECTIVE:   CHIEF COMPLAINT / HPI:   28 year old female presenting today for contraceptive management.  Has been on Depo-Provera  for about 2 years.  Last injection was March 25, 2023.  She is late on her routine schedule Depo-Provera  shots.  LMP was August 26, 2023.Aaron Aas  Was normal and her last intercourse was yesterday and unprotected.  In addition patient reports concerns about diffuse rash on the upper extremity, torso and back area.  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 No other person in the household with similar rash, no recent ill ness. Denies any fevers chills.   PERTINENT  PMH / PSH: Reviewed   OBJECTIVE:   BP (!) 105/58   Pulse 66   Ht 5\' 1"  (1.549 m)   Wt 115 lb 6 oz (52.3 kg)   LMP 08/26/2023   SpO2 100%   BMI 21.80 kg/m    Physical Exam General: Alert, well appearing, NAD Cardiovascular: RRR, No Murmurs, Normal S2/S2 Respiratory: CTAB, No wheezing or Rales Skin: diffused erythematous papular rash       ASSESSMENT/PLAN:   Contraception management Pregnancy test obtained today was negative.  Patient received her Depo-Provera  shots today and advised her next shot to be between July 18-August 1.  Noted LMP 1 week ago however patient had unprotected sex yesterday.  Patient advised to complete home pregnancy test in 2 weeks.   Contact Dermatitis Presenting with diffused rash suspicious of contact dermatitis. Unsure of aggravating factor at this time -Rx famotidine  20 mg dail BID and nightly benadryl   -Rx hydrocortisone cream -Patient to return if symptoms does not improve.  -Encouraged patient to avoid possible triggers    Goble Last, MD [facility]

## 2023-10-14 IMAGING — US US MFM OB COMP +14 WKS
1 series · 14 of 28 positions shown · non-contrast
Comparison: none

[Series 1: us pelvis complete · 14 of 57 slices shown]
[im 3/57]
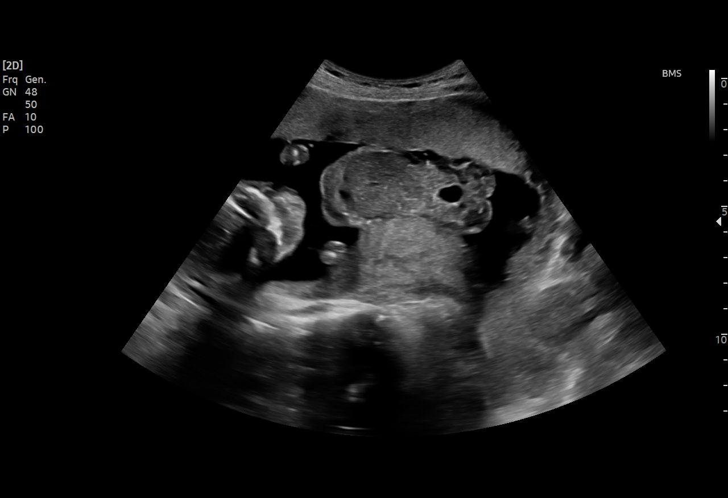
[im 7/57]
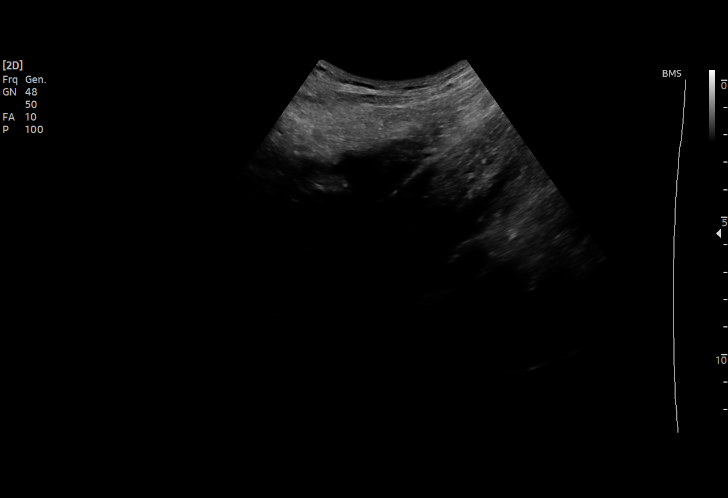
[im 11/57]
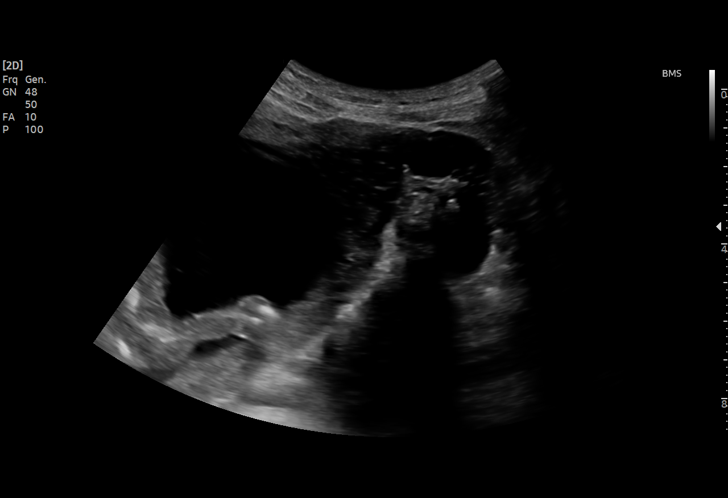
[im 15/57]
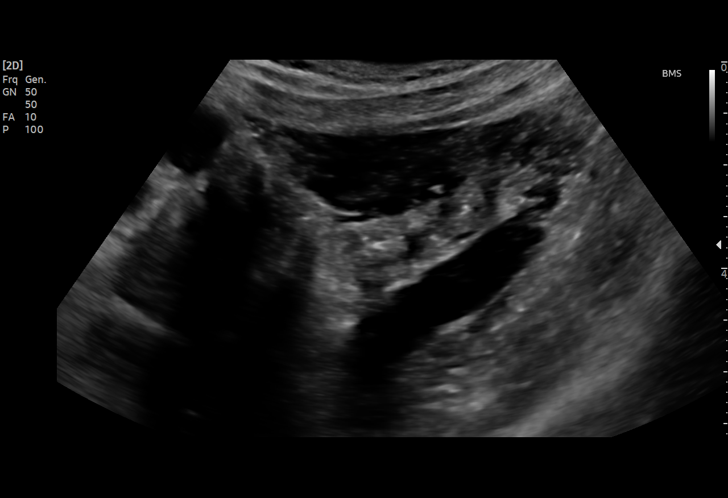
[im 19/57]
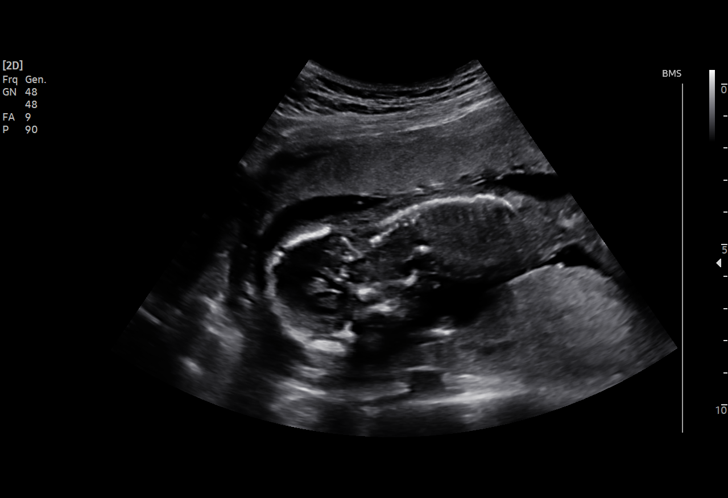
[im 23/57]
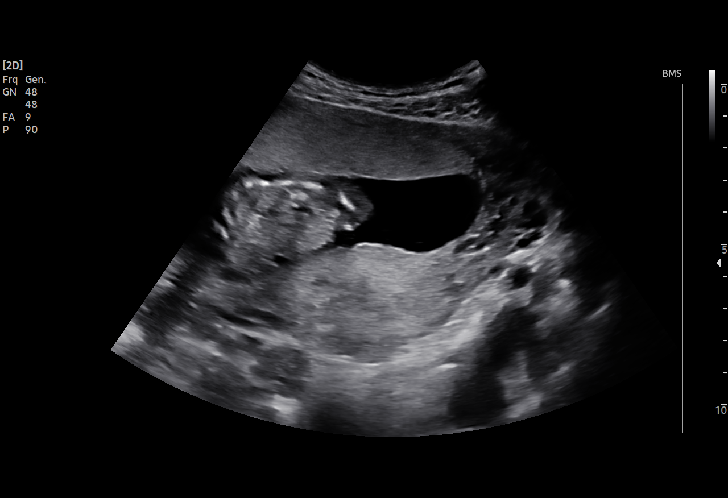
[im 27/57]
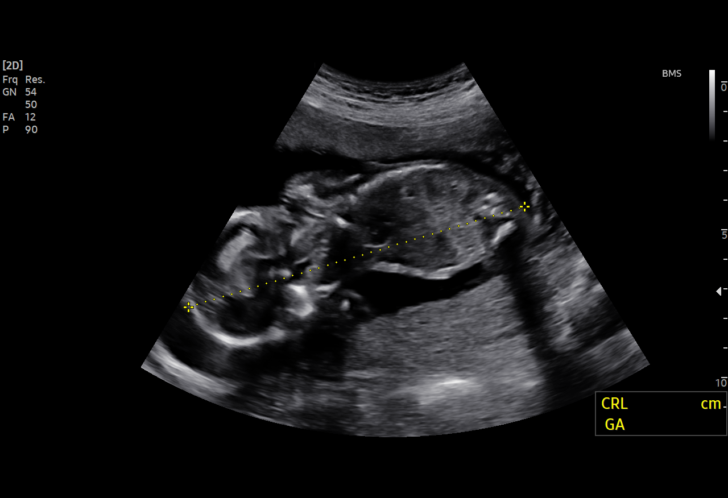
[im 32/57]
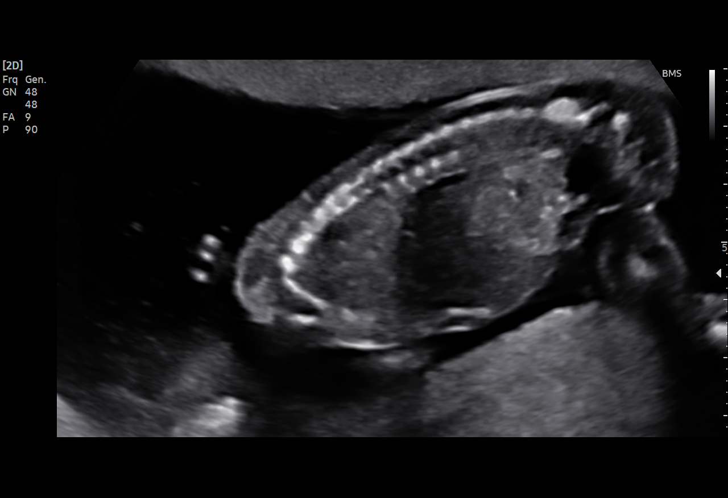
[im 36/57]
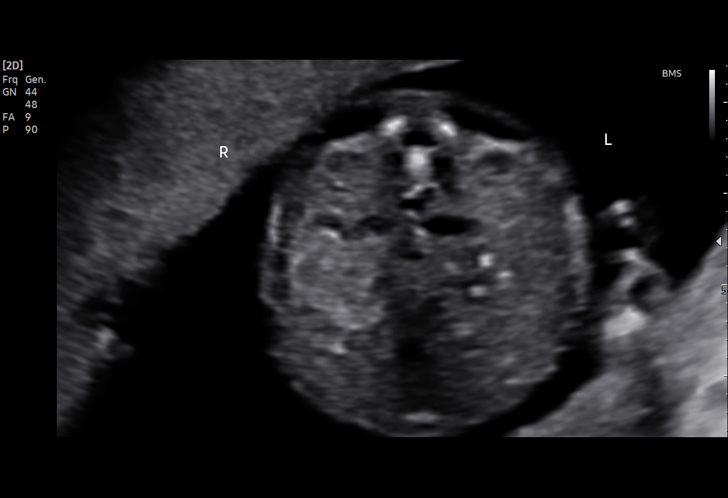
[im 40/57]
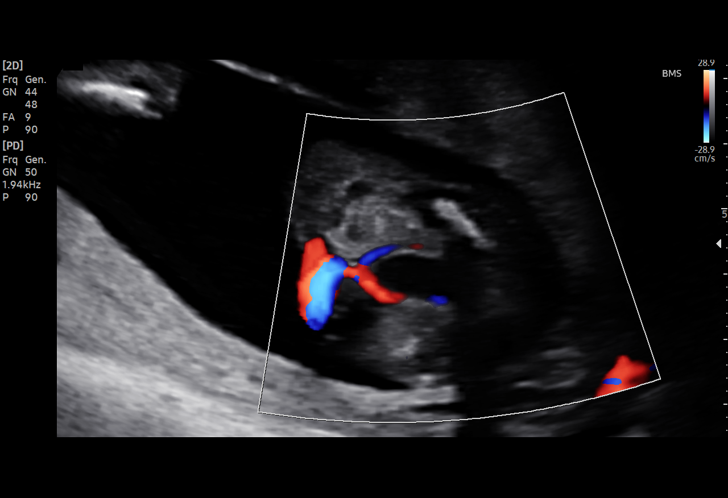
[im 44/57]
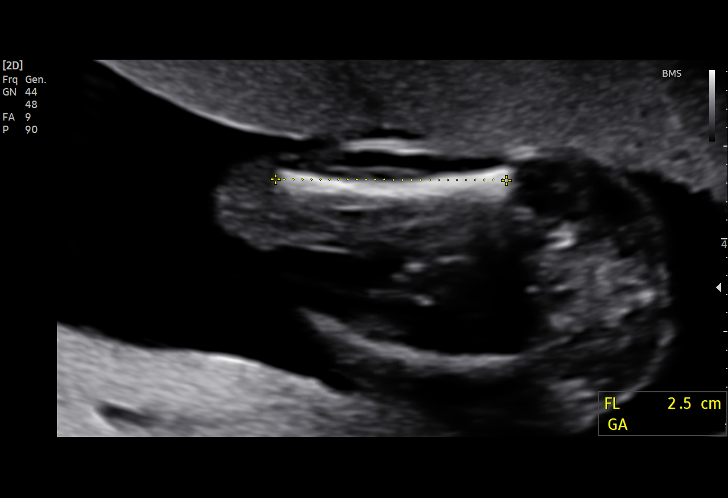
[im 48/57]
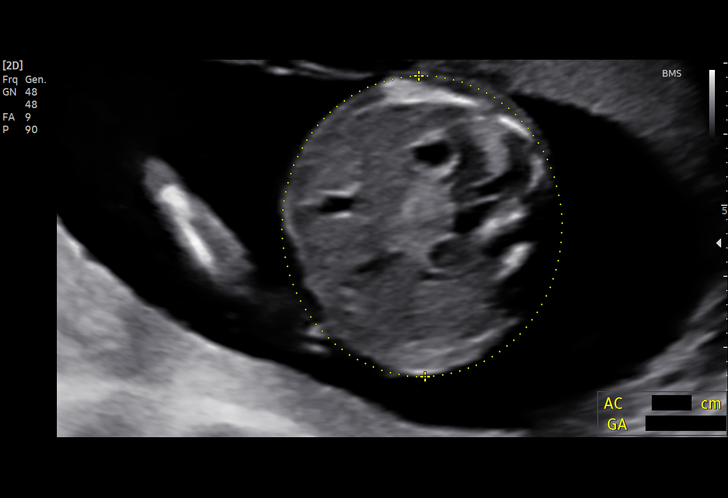
[im 52/57]
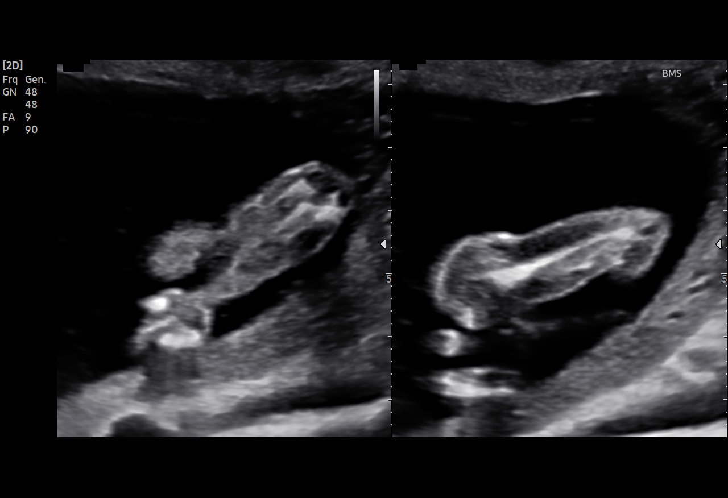
[im 57/57]
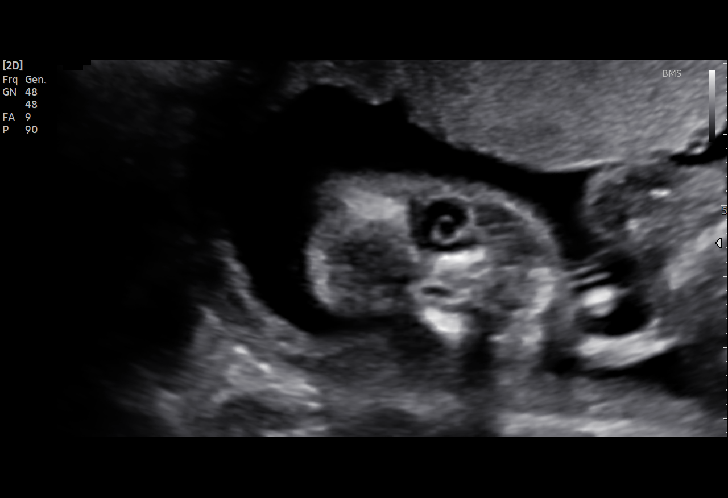

[14 of 28 positions shown; findings below may reference images not displayed]

Name:       TANZER BRI             Visit Date: 10/05/2021 [DATE]

 1  US MFM OB COMP + 14 WK                76805.01    BILALA SKENDER

Indications

 Late prenatal care, second trimester
 Encounter for antenatal screening for
 malformations
 17 weeks gestation of pregnancy
Fetal Evaluation

 Num Of Fetuses:         1
 Fetal Heart Rate(bpm):  164
 Cardiac Activity:       Observed
 Presentation:           Breech
 Placenta:               Anterior
 P. Cord Insertion:      Visualized, central

 Amniotic Fluid
 AFI FV:      Within normal limits
Biometry

 CRL:     118.3  mm     G. Age:  N/A                     EDD:

 BPD:      38.9  mm     G. Age:  17w 6d         56  %    CI:        72.34   %    70 - 86
                                                         FL/HC:      16.5   %    15.8 - 18
 HC:      145.5  mm     G. Age:  17w 5d         43  %    HC/AC:      1.19        1.07 -
 AC:      122.6  mm     G. Age:  17w 6d         55  %    FL/BPD:     61.7   %
 FL:         24  mm     G. Age:  17w 2d         27  %    FL/AC:      19.6   %    20 - 24

 LV:        6.4  mm

 Est. FW:     202  gm      0 lb 7 oz     38  %
OB History

 Gravidity:    5         Term:   1         SAB:   3
 Living:       1
Gestational Age

 U/S Today:     17w 5d                                        EDD:   03/10/22
 Best:          17w 5d     Det. By:  17 weeks gestation of    EDD:   03/10/22
                                     pregnancy
Anatomy

 Cranium:               Visualized             Diaphragm:              Visualized
 Cavum:                 Visualized             Stomach:                Visualized
 Ventricles:            Visualized             Cord Vessels:           Not well visualized
 Cerebellum:            Visualized             Kidneys:                Visualized
 Heart:                 Visualized             Bladder:                Visualized
Cervix Uterus Adnexa

 Cervix
 Normal appearance by transabdominal scan.

 Uterus
 No abnormality visualized.

 Right Ovary
 Not visualized.

 Left Ovary
 Within normal limits.

 Cul De Sac
 No free fluid seen.

 Adnexa
 No adnexal mass visualized.
Comments

 This patient was seen for a dating radiology ultrasound.

 Based on the fetal biometry measurements obtained today,
 her EDC is March 10, 2022, making her 17 weeks and 5
 days pregnant.

 She will return in 2 weeks for a detailed fetal anatomy scan.

 She should have a cell free DNA test to screen for fetal
 aneuploidy if she has not already had the test drawn.

## 2024-01-11 ENCOUNTER — Ambulatory Visit (INDEPENDENT_AMBULATORY_CARE_PROVIDER_SITE_OTHER): Admitting: Family Medicine

## 2024-01-11 ENCOUNTER — Other Ambulatory Visit (HOSPITAL_COMMUNITY)
Admission: RE | Admit: 2024-01-11 | Discharge: 2024-01-11 | Disposition: A | Discharge status: Home / Self Care | Source: Ambulatory Visit | Attending: Family Medicine | Admitting: Family Medicine

## 2024-01-11 VITALS — BP 109/67 | HR 66 | Ht 61.0 in | Wt 115.4 lb

## 2024-01-11 DIAGNOSIS — Z113 Encounter for screening for infections with a predominantly sexual mode of transmission: Secondary | ICD-10-CM

## 2024-01-11 DIAGNOSIS — Z114 Encounter for screening for human immunodeficiency virus [HIV]: Secondary | ICD-10-CM

## 2024-01-11 DIAGNOSIS — Z309 Encounter for contraceptive management, unspecified: Secondary | ICD-10-CM

## 2024-01-11 LAB — POCT URINE PREGNANCY: Preg Test, Ur: NEGATIVE

## 2024-01-11 MED ORDER — MEDROXYPROGESTERONE ACETATE 150 MG/ML IM SUSP
150.0000 mg | Freq: Once | INTRAMUSCULAR | Status: AC
  Administered 2024-01-11: 150 mg via INTRAMUSCULAR

## 2024-01-11 NOTE — Progress Notes (Signed)
    SUBJECTIVE:   CHIEF COMPLAINT / HPI:   STI check -Vaginal discharge, pain, itching: denies -Abdominal pain, fevers: denies -previous history: chlamydia, BV -new partner or partner exposure to STI: denies -desires oral swab: no -LMP: unsure, irregular on Depo, maybe 736mo ago -most recent sexual encounter: this week, no protection -contraception: depo but late for injection -pap: UTD   Contraception management  Was on Depo, last shot 09/02/23, was due for next shot 7/18-8/1. She is late for this  LMP possibly 736mo ago, unsure Last sexual encounter: this week Aware that she will need to repeat preg test in 2 weeks if negative today  PERTINENT  PMH / PSH: na  OBJECTIVE:   BP 109/67   Pulse 66   Ht 5' 1 (1.549 m)   Wt 115 lb 6.4 oz (52.3 kg)   SpO2 100%   BMI 21.80 kg/m   General: NAD, pleasant, able to participate in exam Respiratory: No respiratory distress Skin: warm and dry, no rashes noted Psych: Normal affect and mood  Pelvic exam: normal external genitalia, vulva, vagina, cervix, uterus and adnexa, exam chaperoned by Thersia CMA.  ASSESSMENT/PLAN:   Assessment & Plan Encounter for contraceptive management, unspecified type Pregnancy test negative Depo given today Out of range for depo with recent sexual encounter, Recommend repeat pregnancy test in 2 weeks.  Patient will do this at home.  Advised to send MyChart message with picture of results Screening examination for STI Swab collected today.  No wet mount available in clinic. HIV/RPR as well. Depo as noted   Payton Coward, MD [facility]

## 2024-01-11 NOTE — Patient Instructions (Addendum)
 If any of your results from today are abnormal and/or require changes to your medical care, I will give you a call. Otherwise, I will send you a letter in the mail or a message on MyChart.   Please send me a mychart message in 2 weeks (01/25/24) with the result of your home pregnancy test

## 2024-01-11 NOTE — Assessment & Plan Note (Signed)
 Pregnancy test negative Depo given today Out of range for depo with recent sexual encounter, Recommend repeat pregnancy test in 2 weeks.  Patient will do this at home.  Advised to send MyChart message with picture of results

## 2024-01-12 ENCOUNTER — Other Ambulatory Visit: Payer: Self-pay | Admitting: Family Medicine

## 2024-01-12 ENCOUNTER — Encounter: Payer: Self-pay | Admitting: Family Medicine

## 2024-01-12 LAB — HIV ANTIBODY (ROUTINE TESTING W REFLEX): HIV Screen 4th Generation wRfx: NONREACTIVE

## 2024-01-12 LAB — RPR: RPR Ser Ql: NONREACTIVE

## 2024-01-12 MED ORDER — IBUPROFEN 400 MG PO TABS: 400.0000 mg | ORAL_TABLET | Freq: Two times a day (BID) | ORAL | 0 refills | Status: AC | PRN

## 2024-01-13 ENCOUNTER — Ambulatory Visit: Payer: Self-pay | Admitting: Family Medicine

## 2024-01-13 DIAGNOSIS — B9689 Other specified bacterial agents as the cause of diseases classified elsewhere: Secondary | ICD-10-CM

## 2024-01-13 LAB — CERVICOVAGINAL ANCILLARY ONLY
Bacterial Vaginitis (gardnerella): POSITIVE — AB
Candida Glabrata: NEGATIVE
Candida Vaginitis: NEGATIVE
Chlamydia: NEGATIVE
Comment: NEGATIVE
Comment: NEGATIVE
Comment: NEGATIVE
Comment: NEGATIVE
Comment: NEGATIVE
Comment: NORMAL
Neisseria Gonorrhea: NEGATIVE
Trichomonas: NEGATIVE

## 2024-01-13 MED ORDER — METRONIDAZOLE 500 MG PO TABS: 500.0000 mg | ORAL_TABLET | Freq: Two times a day (BID) | ORAL | 0 refills | Status: AC

## 2024-01-13 MED ORDER — FLUCONAZOLE 150 MG PO TABS: 150.0000 mg | ORAL_TABLET | Freq: Once | ORAL | 0 refills | Status: AC

## 2024-02-21 ENCOUNTER — Encounter: Admitting: Family Medicine

## 2024-03-28 ENCOUNTER — Ambulatory Visit
